# Patient Record
Sex: Female | Born: 1950 | Race: White | Hispanic: No | Marital: Married | Smoking: Never smoker
Health system: Southern US, Community
[De-identification: ages and names within clinical notes are randomized; demographics above are authoritative.]

## PROBLEM LIST (undated history)

## (undated) DIAGNOSIS — I10 Essential (primary) hypertension: Secondary | ICD-10-CM

## (undated) DIAGNOSIS — J302 Other seasonal allergic rhinitis: Secondary | ICD-10-CM

## (undated) DIAGNOSIS — M1811 Unilateral primary osteoarthritis of first carpometacarpal joint, right hand: Secondary | ICD-10-CM

## (undated) DIAGNOSIS — K219 Gastro-esophageal reflux disease without esophagitis: Secondary | ICD-10-CM

## (undated) HISTORY — PX: LASIK: SHX215

## (undated) HISTORY — PX: TONSILLECTOMY: SUR1361

---

## 1998-10-19 ENCOUNTER — Other Ambulatory Visit: Admission: RE | Admit: 1998-10-19 | Discharge: 1998-10-19 | Payer: Self-pay | Admitting: Obstetrics and Gynecology

## 1999-10-20 ENCOUNTER — Other Ambulatory Visit: Admission: RE | Admit: 1999-10-20 | Discharge: 1999-10-20 | Payer: Self-pay | Admitting: Obstetrics and Gynecology

## 2000-08-30 ENCOUNTER — Encounter: Payer: Self-pay | Admitting: Obstetrics and Gynecology

## 2000-08-30 ENCOUNTER — Encounter
Admission: RE | Admit: 2000-08-30 | Discharge: 2000-08-30 | Payer: Self-pay | Admitting: Physical Medicine & Rehabilitation

## 2000-11-22 ENCOUNTER — Other Ambulatory Visit: Admission: RE | Admit: 2000-11-22 | Discharge: 2000-11-22 | Payer: Self-pay | Admitting: Obstetrics and Gynecology

## 2001-09-13 ENCOUNTER — Encounter: Payer: Self-pay | Admitting: Obstetrics and Gynecology

## 2001-09-13 ENCOUNTER — Encounter: Admission: RE | Admit: 2001-09-13 | Discharge: 2001-09-13 | Payer: Self-pay | Admitting: Obstetrics and Gynecology

## 2002-05-05 ENCOUNTER — Encounter: Payer: Self-pay | Admitting: Obstetrics and Gynecology

## 2002-05-05 ENCOUNTER — Encounter: Admission: RE | Admit: 2002-05-05 | Discharge: 2002-05-05 | Payer: Self-pay | Admitting: Obstetrics and Gynecology

## 2002-07-01 ENCOUNTER — Encounter: Admission: RE | Admit: 2002-07-01 | Discharge: 2002-07-01 | Payer: Self-pay | Admitting: Obstetrics and Gynecology

## 2002-07-01 ENCOUNTER — Encounter: Payer: Self-pay | Admitting: Obstetrics and Gynecology

## 2002-10-21 ENCOUNTER — Encounter: Admission: RE | Admit: 2002-10-21 | Discharge: 2002-10-21 | Payer: Self-pay | Admitting: Obstetrics and Gynecology

## 2002-10-21 ENCOUNTER — Encounter: Payer: Self-pay | Admitting: Obstetrics and Gynecology

## 2003-10-26 ENCOUNTER — Encounter: Admission: RE | Admit: 2003-10-26 | Discharge: 2003-10-26 | Payer: Self-pay | Admitting: Obstetrics and Gynecology

## 2004-06-07 ENCOUNTER — Other Ambulatory Visit: Admission: RE | Admit: 2004-06-07 | Discharge: 2004-06-07 | Payer: Self-pay | Admitting: Obstetrics and Gynecology

## 2004-10-26 ENCOUNTER — Encounter: Admission: RE | Admit: 2004-10-26 | Discharge: 2004-10-26 | Payer: Self-pay | Admitting: Obstetrics and Gynecology

## 2005-05-31 ENCOUNTER — Other Ambulatory Visit: Admission: RE | Admit: 2005-05-31 | Discharge: 2005-05-31 | Payer: Self-pay | Admitting: Obstetrics and Gynecology

## 2005-11-10 ENCOUNTER — Encounter: Admission: RE | Admit: 2005-11-10 | Discharge: 2005-11-10 | Payer: Self-pay | Admitting: Obstetrics and Gynecology

## 2006-06-07 ENCOUNTER — Other Ambulatory Visit: Admission: RE | Admit: 2006-06-07 | Discharge: 2006-06-07 | Payer: Self-pay | Admitting: Obstetrics and Gynecology

## 2006-11-15 ENCOUNTER — Encounter: Admission: RE | Admit: 2006-11-15 | Discharge: 2006-11-15 | Payer: Self-pay | Admitting: Obstetrics and Gynecology

## 2007-06-10 ENCOUNTER — Other Ambulatory Visit: Admission: RE | Admit: 2007-06-10 | Discharge: 2007-06-10 | Payer: Self-pay | Admitting: Obstetrics and Gynecology

## 2007-11-21 ENCOUNTER — Encounter: Admission: RE | Admit: 2007-11-21 | Discharge: 2007-11-21 | Payer: Self-pay | Admitting: Obstetrics and Gynecology

## 2008-06-09 ENCOUNTER — Other Ambulatory Visit: Admission: RE | Admit: 2008-06-09 | Discharge: 2008-06-09 | Payer: Self-pay | Admitting: Obstetrics and Gynecology

## 2008-11-23 ENCOUNTER — Encounter: Admission: RE | Admit: 2008-11-23 | Discharge: 2008-11-23 | Payer: Self-pay | Admitting: Obstetrics and Gynecology

## 2009-06-11 ENCOUNTER — Other Ambulatory Visit: Admission: RE | Admit: 2009-06-11 | Discharge: 2009-06-11 | Payer: Self-pay | Admitting: Obstetrics and Gynecology

## 2009-11-24 ENCOUNTER — Encounter: Admission: RE | Admit: 2009-11-24 | Discharge: 2009-11-24 | Payer: Self-pay | Admitting: Obstetrics and Gynecology

## 2010-06-13 ENCOUNTER — Other Ambulatory Visit: Admission: RE | Admit: 2010-06-13 | Discharge: 2010-06-13 | Payer: Self-pay | Admitting: Obstetrics and Gynecology

## 2010-11-25 ENCOUNTER — Encounter
Admission: RE | Admit: 2010-11-25 | Discharge: 2010-11-25 | Payer: Self-pay | Source: Home / Self Care | Attending: Obstetrics and Gynecology | Admitting: Obstetrics and Gynecology

## 2011-06-20 ENCOUNTER — Other Ambulatory Visit: Payer: Self-pay | Admitting: Obstetrics and Gynecology

## 2011-06-20 ENCOUNTER — Other Ambulatory Visit (HOSPITAL_COMMUNITY)
Admission: RE | Admit: 2011-06-20 | Discharge: 2011-06-20 | Disposition: A | Discharge status: Home / Self Care | Payer: PRIVATE HEALTH INSURANCE | Source: Ambulatory Visit | Attending: Obstetrics and Gynecology | Admitting: Obstetrics and Gynecology

## 2011-06-20 DIAGNOSIS — Z01419 Encounter for gynecological examination (general) (routine) without abnormal findings: Secondary | ICD-10-CM | POA: Insufficient documentation

## 2011-11-02 ENCOUNTER — Other Ambulatory Visit: Payer: Self-pay | Admitting: Obstetrics and Gynecology

## 2011-11-02 DIAGNOSIS — Z1231 Encounter for screening mammogram for malignant neoplasm of breast: Secondary | ICD-10-CM

## 2011-11-27 ENCOUNTER — Ambulatory Visit
Admission: RE | Admit: 2011-11-27 | Discharge: 2011-11-27 | Disposition: A | Discharge status: Home / Self Care | Payer: PRIVATE HEALTH INSURANCE | Source: Ambulatory Visit | Attending: Obstetrics and Gynecology | Admitting: Obstetrics and Gynecology

## 2011-11-27 DIAGNOSIS — Z1231 Encounter for screening mammogram for malignant neoplasm of breast: Secondary | ICD-10-CM

## 2012-06-25 ENCOUNTER — Other Ambulatory Visit: Payer: Self-pay | Admitting: Obstetrics and Gynecology

## 2012-06-25 ENCOUNTER — Other Ambulatory Visit (HOSPITAL_COMMUNITY)
Admission: RE | Admit: 2012-06-25 | Discharge: 2012-06-25 | Disposition: A | Discharge status: Home / Self Care | Payer: PRIVATE HEALTH INSURANCE | Source: Ambulatory Visit | Attending: Obstetrics and Gynecology | Admitting: Obstetrics and Gynecology

## 2012-06-25 DIAGNOSIS — Z01419 Encounter for gynecological examination (general) (routine) without abnormal findings: Secondary | ICD-10-CM | POA: Insufficient documentation

## 2012-10-21 ENCOUNTER — Other Ambulatory Visit: Payer: Self-pay | Admitting: Obstetrics and Gynecology

## 2012-10-21 DIAGNOSIS — Z1231 Encounter for screening mammogram for malignant neoplasm of breast: Secondary | ICD-10-CM

## 2012-11-27 ENCOUNTER — Ambulatory Visit
Admission: RE | Admit: 2012-11-27 | Discharge: 2012-11-27 | Disposition: A | Discharge status: Home / Self Care | Payer: PRIVATE HEALTH INSURANCE | Source: Ambulatory Visit | Attending: Obstetrics and Gynecology | Admitting: Obstetrics and Gynecology

## 2012-11-27 DIAGNOSIS — Z1231 Encounter for screening mammogram for malignant neoplasm of breast: Secondary | ICD-10-CM

## 2013-06-26 ENCOUNTER — Other Ambulatory Visit: Payer: Self-pay | Admitting: Obstetrics and Gynecology

## 2013-06-26 ENCOUNTER — Other Ambulatory Visit (HOSPITAL_COMMUNITY)
Admission: RE | Admit: 2013-06-26 | Discharge: 2013-06-26 | Disposition: A | Discharge status: Home / Self Care | Payer: PRIVATE HEALTH INSURANCE | Source: Ambulatory Visit | Attending: Obstetrics and Gynecology | Admitting: Obstetrics and Gynecology

## 2013-06-26 DIAGNOSIS — Z1151 Encounter for screening for human papillomavirus (HPV): Secondary | ICD-10-CM | POA: Insufficient documentation

## 2013-06-26 DIAGNOSIS — Z01419 Encounter for gynecological examination (general) (routine) without abnormal findings: Secondary | ICD-10-CM | POA: Insufficient documentation

## 2013-10-15 ENCOUNTER — Other Ambulatory Visit: Payer: Self-pay

## 2013-10-15 DIAGNOSIS — Z1231 Encounter for screening mammogram for malignant neoplasm of breast: Secondary | ICD-10-CM

## 2013-11-28 ENCOUNTER — Ambulatory Visit
Admission: RE | Admit: 2013-11-28 | Discharge: 2013-11-28 | Disposition: A | Discharge status: Home / Self Care | Payer: PRIVATE HEALTH INSURANCE | Source: Ambulatory Visit

## 2013-11-28 DIAGNOSIS — Z1231 Encounter for screening mammogram for malignant neoplasm of breast: Secondary | ICD-10-CM

## 2014-06-30 ENCOUNTER — Other Ambulatory Visit (HOSPITAL_COMMUNITY)
Admission: RE | Admit: 2014-06-30 | Discharge: 2014-06-30 | Disposition: A | Discharge status: Home / Self Care | Payer: PRIVATE HEALTH INSURANCE | Source: Ambulatory Visit | Attending: Obstetrics and Gynecology | Admitting: Obstetrics and Gynecology

## 2014-06-30 ENCOUNTER — Other Ambulatory Visit: Payer: Self-pay | Admitting: Obstetrics and Gynecology

## 2014-06-30 DIAGNOSIS — Z01419 Encounter for gynecological examination (general) (routine) without abnormal findings: Secondary | ICD-10-CM | POA: Insufficient documentation

## 2014-07-01 LAB — CYTOLOGY - PAP

## 2014-11-10 ENCOUNTER — Other Ambulatory Visit: Payer: Self-pay

## 2014-11-10 DIAGNOSIS — Z1231 Encounter for screening mammogram for malignant neoplasm of breast: Secondary | ICD-10-CM

## 2014-12-02 ENCOUNTER — Ambulatory Visit
Admission: RE | Admit: 2014-12-02 | Discharge: 2014-12-02 | Disposition: A | Discharge status: Home / Self Care | Payer: PRIVATE HEALTH INSURANCE | Source: Ambulatory Visit

## 2014-12-02 DIAGNOSIS — Z1231 Encounter for screening mammogram for malignant neoplasm of breast: Secondary | ICD-10-CM

## 2015-07-07 ENCOUNTER — Other Ambulatory Visit (HOSPITAL_COMMUNITY)
Admission: RE | Admit: 2015-07-07 | Discharge: 2015-07-07 | Disposition: A | Discharge status: Home / Self Care | Payer: PRIVATE HEALTH INSURANCE | Source: Ambulatory Visit | Attending: Obstetrics and Gynecology | Admitting: Obstetrics and Gynecology

## 2015-07-07 ENCOUNTER — Other Ambulatory Visit: Payer: Self-pay | Admitting: Obstetrics and Gynecology

## 2015-07-07 DIAGNOSIS — Z01419 Encounter for gynecological examination (general) (routine) without abnormal findings: Secondary | ICD-10-CM | POA: Diagnosis present

## 2015-07-08 LAB — CYTOLOGY - PAP

## 2015-11-10 ENCOUNTER — Other Ambulatory Visit: Payer: Self-pay

## 2015-11-10 DIAGNOSIS — Z1231 Encounter for screening mammogram for malignant neoplasm of breast: Secondary | ICD-10-CM

## 2015-12-06 ENCOUNTER — Ambulatory Visit
Admission: RE | Admit: 2015-12-06 | Discharge: 2015-12-06 | Disposition: A | Discharge status: Home / Self Care | Payer: PRIVATE HEALTH INSURANCE | Source: Ambulatory Visit

## 2015-12-06 DIAGNOSIS — Z1231 Encounter for screening mammogram for malignant neoplasm of breast: Secondary | ICD-10-CM

## 2016-07-07 ENCOUNTER — Other Ambulatory Visit: Payer: Self-pay | Admitting: Obstetrics and Gynecology

## 2016-07-07 ENCOUNTER — Other Ambulatory Visit (HOSPITAL_COMMUNITY)
Admission: RE | Admit: 2016-07-07 | Discharge: 2016-07-07 | Disposition: A | Discharge status: Home / Self Care | Payer: PRIVATE HEALTH INSURANCE | Source: Ambulatory Visit | Attending: Obstetrics and Gynecology | Admitting: Obstetrics and Gynecology

## 2016-07-07 DIAGNOSIS — Z1151 Encounter for screening for human papillomavirus (HPV): Secondary | ICD-10-CM | POA: Diagnosis present

## 2016-07-07 DIAGNOSIS — Z01419 Encounter for gynecological examination (general) (routine) without abnormal findings: Secondary | ICD-10-CM | POA: Diagnosis present

## 2016-07-11 LAB — CYTOLOGY - PAP

## 2016-11-10 ENCOUNTER — Other Ambulatory Visit: Payer: Self-pay | Admitting: Obstetrics and Gynecology

## 2016-11-10 DIAGNOSIS — Z1231 Encounter for screening mammogram for malignant neoplasm of breast: Secondary | ICD-10-CM

## 2016-12-06 ENCOUNTER — Ambulatory Visit
Admission: RE | Admit: 2016-12-06 | Discharge: 2016-12-06 | Disposition: A | Discharge status: Home / Self Care | Payer: PRIVATE HEALTH INSURANCE | Source: Ambulatory Visit | Attending: Obstetrics and Gynecology | Admitting: Obstetrics and Gynecology

## 2016-12-06 DIAGNOSIS — Z1231 Encounter for screening mammogram for malignant neoplasm of breast: Secondary | ICD-10-CM

## 2017-10-26 ENCOUNTER — Other Ambulatory Visit: Payer: Self-pay | Admitting: Obstetrics and Gynecology

## 2017-10-26 DIAGNOSIS — Z1231 Encounter for screening mammogram for malignant neoplasm of breast: Secondary | ICD-10-CM

## 2017-12-07 ENCOUNTER — Ambulatory Visit
Admission: RE | Admit: 2017-12-07 | Discharge: 2017-12-07 | Disposition: A | Discharge status: Home / Self Care | Payer: PRIVATE HEALTH INSURANCE | Source: Ambulatory Visit | Attending: Obstetrics and Gynecology | Admitting: Obstetrics and Gynecology

## 2017-12-07 DIAGNOSIS — Z1231 Encounter for screening mammogram for malignant neoplasm of breast: Secondary | ICD-10-CM

## 2018-03-28 ENCOUNTER — Other Ambulatory Visit: Payer: Self-pay | Admitting: Orthopedic Surgery

## 2018-04-06 DIAGNOSIS — M1811 Unilateral primary osteoarthritis of first carpometacarpal joint, right hand: Secondary | ICD-10-CM

## 2018-04-06 HISTORY — DX: Unilateral primary osteoarthritis of first carpometacarpal joint, right hand: M18.11

## 2018-04-25 ENCOUNTER — Other Ambulatory Visit: Payer: Self-pay

## 2018-04-25 ENCOUNTER — Encounter (HOSPITAL_BASED_OUTPATIENT_CLINIC_OR_DEPARTMENT_OTHER): Payer: Self-pay | Admitting: *Deleted

## 2018-04-25 NOTE — Pre-Procedure Instructions (Signed)
To come for EKG 

## 2018-05-02 ENCOUNTER — Encounter (HOSPITAL_BASED_OUTPATIENT_CLINIC_OR_DEPARTMENT_OTHER)
Admission: RE | Disposition: A | Discharge status: Home / Self Care | Payer: Self-pay | Source: Ambulatory Visit | Attending: Orthopedic Surgery

## 2018-05-02 ENCOUNTER — Ambulatory Visit (HOSPITAL_BASED_OUTPATIENT_CLINIC_OR_DEPARTMENT_OTHER)
Admission: RE | Admit: 2018-05-02 | Discharge: 2018-05-02 | Disposition: A | Discharge status: Home / Self Care | Payer: PRIVATE HEALTH INSURANCE | Source: Ambulatory Visit | Attending: Orthopedic Surgery | Admitting: Orthopedic Surgery

## 2018-05-02 ENCOUNTER — Encounter (HOSPITAL_BASED_OUTPATIENT_CLINIC_OR_DEPARTMENT_OTHER): Payer: Self-pay | Admitting: Anesthesiology

## 2018-05-02 ENCOUNTER — Other Ambulatory Visit: Payer: Self-pay

## 2018-05-02 ENCOUNTER — Ambulatory Visit (HOSPITAL_BASED_OUTPATIENT_CLINIC_OR_DEPARTMENT_OTHER): Payer: PRIVATE HEALTH INSURANCE | Admitting: Anesthesiology

## 2018-05-02 DIAGNOSIS — Z791 Long term (current) use of non-steroidal anti-inflammatories (NSAID): Secondary | ICD-10-CM | POA: Insufficient documentation

## 2018-05-02 DIAGNOSIS — Z79899 Other long term (current) drug therapy: Secondary | ICD-10-CM | POA: Diagnosis not present

## 2018-05-02 DIAGNOSIS — M1811 Unilateral primary osteoarthritis of first carpometacarpal joint, right hand: Secondary | ICD-10-CM | POA: Diagnosis present

## 2018-05-02 DIAGNOSIS — Z880 Allergy status to penicillin: Secondary | ICD-10-CM | POA: Insufficient documentation

## 2018-05-02 DIAGNOSIS — I110 Hypertensive heart disease with heart failure: Secondary | ICD-10-CM | POA: Insufficient documentation

## 2018-05-02 HISTORY — PX: CARPOMETACARPEL SUSPENSION PLASTY: SHX5005

## 2018-05-02 HISTORY — DX: Gastro-esophageal reflux disease without esophagitis: K21.9

## 2018-05-02 HISTORY — PX: TENDON TRANSFER: SHX6109

## 2018-05-02 HISTORY — DX: Other seasonal allergic rhinitis: J30.2

## 2018-05-02 HISTORY — DX: Unilateral primary osteoarthritis of first carpometacarpal joint, right hand: M18.11

## 2018-05-02 HISTORY — DX: Essential (primary) hypertension: I10

## 2018-05-02 SURGERY — CARPOMETACARPEL (CMC) SUSPENSION PLASTY
Anesthesia: General | Site: Hand | Laterality: Right

## 2018-05-02 MED ORDER — VANCOMYCIN HCL IN DEXTROSE 500-5 MG/100ML-% IV SOLN
INTRAVENOUS | Status: AC
  Filled 2018-05-02: qty 100

## 2018-05-02 MED ORDER — CEFAZOLIN SODIUM-DEXTROSE 2-4 GM/100ML-% IV SOLN: 2.0000 g | INTRAVENOUS | Status: DC

## 2018-05-02 MED ORDER — ACETAMINOPHEN 160 MG/5ML PO SOLN: 325.0000 mg | ORAL | Status: DC | PRN

## 2018-05-02 MED ORDER — LACTATED RINGERS IV SOLN
INTRAVENOUS | Status: DC
  Administered 2018-05-02: 11:00:00 via INTRAVENOUS

## 2018-05-02 MED ORDER — CEFAZOLIN SODIUM-DEXTROSE 2-4 GM/100ML-% IV SOLN
INTRAVENOUS | Status: AC
  Filled 2018-05-02: qty 100

## 2018-05-02 MED ORDER — LIDOCAINE HCL (CARDIAC) PF 100 MG/5ML IV SOSY
PREFILLED_SYRINGE | INTRAVENOUS | Status: DC | PRN
  Administered 2018-05-02: 30 mg via INTRAVENOUS

## 2018-05-02 MED ORDER — FENTANYL CITRATE (PF) 100 MCG/2ML IJ SOLN
INTRAMUSCULAR | Status: AC
  Filled 2018-05-02: qty 2

## 2018-05-02 MED ORDER — ONDANSETRON HCL 4 MG/2ML IJ SOLN
INTRAMUSCULAR | Status: DC | PRN
  Administered 2018-05-02: 4 mg via INTRAVENOUS

## 2018-05-02 MED ORDER — BUPIVACAINE HCL (PF) 0.75 % IJ SOLN
INTRAMUSCULAR | Status: DC | PRN
  Administered 2018-05-02: 25 mL via PERINEURAL

## 2018-05-02 MED ORDER — CHLORHEXIDINE GLUCONATE 4 % EX LIQD: 60.0000 mL | Freq: Once | CUTANEOUS | Status: DC

## 2018-05-02 MED ORDER — OXYCODONE-ACETAMINOPHEN 5-325 MG PO TABS: 1.0000 | ORAL_TABLET | ORAL | 0 refills | Status: AC | PRN

## 2018-05-02 MED ORDER — FENTANYL CITRATE (PF) 100 MCG/2ML IJ SOLN
50.0000 ug | INTRAMUSCULAR | Status: DC | PRN
  Administered 2018-05-02: 50 ug via INTRAVENOUS

## 2018-05-02 MED ORDER — LIDOCAINE HCL (CARDIAC) PF 100 MG/5ML IV SOSY
PREFILLED_SYRINGE | INTRAVENOUS | Status: AC
  Filled 2018-05-02: qty 5

## 2018-05-02 MED ORDER — MIDAZOLAM HCL 2 MG/2ML IJ SOLN
INTRAMUSCULAR | Status: AC
  Filled 2018-05-02: qty 2

## 2018-05-02 MED ORDER — BUPIVACAINE HCL (PF) 0.25 % IJ SOLN
INTRAMUSCULAR | Status: AC
  Filled 2018-05-02: qty 30

## 2018-05-02 MED ORDER — MIDAZOLAM HCL 2 MG/2ML IJ SOLN
1.0000 mg | INTRAMUSCULAR | Status: DC | PRN
  Administered 2018-05-02: 1 mg via INTRAVENOUS

## 2018-05-02 MED ORDER — MEPERIDINE HCL 25 MG/ML IJ SOLN: 6.2500 mg | INTRAMUSCULAR | Status: DC | PRN

## 2018-05-02 MED ORDER — DEXAMETHASONE SODIUM PHOSPHATE 10 MG/ML IJ SOLN
INTRAMUSCULAR | Status: AC
  Filled 2018-05-02: qty 1

## 2018-05-02 MED ORDER — ONDANSETRON HCL 4 MG/2ML IJ SOLN: 4.0000 mg | Freq: Once | INTRAMUSCULAR | Status: DC | PRN

## 2018-05-02 MED ORDER — EPHEDRINE SULFATE 50 MG/ML IJ SOLN
INTRAMUSCULAR | Status: DC | PRN
  Administered 2018-05-02: 10 mg via INTRAVENOUS

## 2018-05-02 MED ORDER — VANCOMYCIN HCL 500 MG IV SOLR
500.0000 mg | Freq: Once | INTRAVENOUS | Status: AC
  Administered 2018-05-02: 500 mg via INTRAVENOUS

## 2018-05-02 MED ORDER — SCOPOLAMINE 1 MG/3DAYS TD PT72: 1.0000 | MEDICATED_PATCH | Freq: Once | TRANSDERMAL | Status: DC | PRN

## 2018-05-02 MED ORDER — OXYCODONE HCL 5 MG PO TABS: 5.0000 mg | ORAL_TABLET | Freq: Once | ORAL | Status: DC | PRN

## 2018-05-02 MED ORDER — ONDANSETRON HCL 4 MG/2ML IJ SOLN
INTRAMUSCULAR | Status: AC
  Filled 2018-05-02: qty 2

## 2018-05-02 MED ORDER — DEXAMETHASONE SODIUM PHOSPHATE 10 MG/ML IJ SOLN
INTRAMUSCULAR | Status: DC | PRN
  Administered 2018-05-02: 4 mg via INTRAVENOUS

## 2018-05-02 MED ORDER — ACETAMINOPHEN 325 MG PO TABS: 325.0000 mg | ORAL_TABLET | ORAL | Status: DC | PRN

## 2018-05-02 MED ORDER — PROPOFOL 10 MG/ML IV BOLUS
INTRAVENOUS | Status: DC | PRN
  Administered 2018-05-02: 100 mg via INTRAVENOUS

## 2018-05-02 MED ORDER — OXYCODONE HCL 5 MG/5ML PO SOLN: 5.0000 mg | Freq: Once | ORAL | Status: DC | PRN

## 2018-05-02 MED ORDER — FENTANYL CITRATE (PF) 100 MCG/2ML IJ SOLN: 25.0000 ug | INTRAMUSCULAR | Status: DC | PRN

## 2018-05-02 SURGICAL SUPPLY — 86 items
BAG DECANTER FOR FLEXI CONT (MISCELLANEOUS) IMPLANT
BALL CTTN LRG ABS STRL LF (GAUZE/BANDAGES/DRESSINGS)
BIT DRILL 3/32DIAX5INL DISPOSE (BIT) IMPLANT
BIT DRILL 3/32DX5IN DISP (BIT) ×1
BLADE MINI RND TIP GREEN BEAV (BLADE) ×3 IMPLANT
BLADE SURG 15 STRL LF DISP TIS (BLADE) ×1 IMPLANT
BLADE SURG 15 STRL SS (BLADE) ×3
BNDG CMPR 9X4 STRL LF SNTH (GAUZE/BANDAGES/DRESSINGS) ×1
BNDG COHESIVE 3X5 TAN STRL LF (GAUZE/BANDAGES/DRESSINGS) ×3 IMPLANT
BNDG ESMARK 4X9 LF (GAUZE/BANDAGES/DRESSINGS) ×3 IMPLANT
BNDG GAUZE ELAST 4 BULKY (GAUZE/BANDAGES/DRESSINGS) ×3 IMPLANT
CHLORAPREP W/TINT 26ML (MISCELLANEOUS) ×3 IMPLANT
CORD BIPOLAR FORCEPS 12FT (ELECTRODE) ×3 IMPLANT
COTTONBALL LRG STERILE PKG (GAUZE/BANDAGES/DRESSINGS) IMPLANT
COVER BACK TABLE 60X90IN (DRAPES) ×3 IMPLANT
COVER MAYO STAND STRL (DRAPES) ×3 IMPLANT
CUFF TOURNIQUET SINGLE 18IN (TOURNIQUET CUFF) ×2 IMPLANT
DECANTER SPIKE VIAL GLASS SM (MISCELLANEOUS) IMPLANT
DRAIN TLS ROUND 10FR (DRAIN) IMPLANT
DRAPE EXTREMITY T 121X128X90 (DRAPE) ×3 IMPLANT
DRAPE OEC MINIVIEW 54X84 (DRAPES) ×3 IMPLANT
DRAPE SURG 17X23 STRL (DRAPES) ×3 IMPLANT
DRILL BIT 3/32DIAX5INL DISPOSE (BIT) ×3
DRSG PAD ABDOMINAL 8X10 ST (GAUZE/BANDAGES/DRESSINGS) IMPLANT
GAUZE SPONGE 4X4 12PLY STRL (GAUZE/BANDAGES/DRESSINGS) ×3 IMPLANT
GAUZE SPONGE 4X4 16PLY XRAY LF (GAUZE/BANDAGES/DRESSINGS) IMPLANT
GAUZE XEROFORM 1X8 LF (GAUZE/BANDAGES/DRESSINGS) ×3 IMPLANT
GLOVE BIO SURGEON STRL SZ 6.5 (GLOVE) ×1 IMPLANT
GLOVE BIO SURGEON STRL SZ7.5 (GLOVE) ×2 IMPLANT
GLOVE BIO SURGEONS STRL SZ 6.5 (GLOVE) ×1
GLOVE BIOGEL PI IND STRL 6.5 (GLOVE) IMPLANT
GLOVE BIOGEL PI IND STRL 8 (GLOVE) IMPLANT
GLOVE BIOGEL PI IND STRL 8.5 (GLOVE) ×1 IMPLANT
GLOVE BIOGEL PI INDICATOR 6.5 (GLOVE) ×2
GLOVE BIOGEL PI INDICATOR 8 (GLOVE) ×2
GLOVE BIOGEL PI INDICATOR 8.5 (GLOVE) ×2
GLOVE SURG ORTHO 8.0 STRL STRW (GLOVE) ×3 IMPLANT
GOWN STRL REUS W/ TWL LRG LVL3 (GOWN DISPOSABLE) ×1 IMPLANT
GOWN STRL REUS W/TWL LRG LVL3 (GOWN DISPOSABLE) ×3
GOWN STRL REUS W/TWL XL LVL3 (GOWN DISPOSABLE) ×5 IMPLANT
K-WIRE .035X4 (WIRE) IMPLANT
LOOP VESSEL MAXI BLUE (MISCELLANEOUS) IMPLANT
NDL KEITH (NEEDLE) IMPLANT
NDL PRECISIONGLIDE 27X1.5 (NEEDLE) IMPLANT
NEEDLE HYPO 22GX1.5 SAFETY (NEEDLE) IMPLANT
NEEDLE KEITH (NEEDLE) IMPLANT
NEEDLE PRECISIONGLIDE 27X1.5 (NEEDLE) ×3 IMPLANT
NS IRRIG 1000ML POUR BTL (IV SOLUTION) ×3 IMPLANT
PACK BASIN DAY SURGERY FS (CUSTOM PROCEDURE TRAY) ×3 IMPLANT
PAD CAST 3X4 CTTN HI CHSV (CAST SUPPLIES) ×1 IMPLANT
PADDING CAST ABS 3INX4YD NS (CAST SUPPLIES)
PADDING CAST ABS 4INX4YD NS (CAST SUPPLIES) ×2
PADDING CAST ABS COTTON 3X4 (CAST SUPPLIES) IMPLANT
PADDING CAST ABS COTTON 4X4 ST (CAST SUPPLIES) ×1 IMPLANT
PADDING CAST COTTON 3X4 STRL (CAST SUPPLIES) ×3
SLEEVE SCD COMPRESS KNEE MED (MISCELLANEOUS) ×3 IMPLANT
SPLINT PLASTER CAST XFAST 3X15 (CAST SUPPLIES) IMPLANT
SPLINT PLASTER XTRA FASTSET 3X (CAST SUPPLIES) ×20
STOCKINETTE 4X48 STRL (DRAPES) ×3 IMPLANT
SUT CHROMIC 5 0 P 3 (SUTURE) IMPLANT
SUT ETHIBOND 3-0 V-5 (SUTURE) ×3 IMPLANT
SUT ETHILON 4 0 PS 2 18 (SUTURE) ×3 IMPLANT
SUT FIBERWIRE 2-0 18 17.9 3/8 (SUTURE)
SUT FIBERWIRE 4-0 18 DIAM BLUE (SUTURE)
SUT FIBERWIRE 4-0 18 TAPR NDL (SUTURE)
SUT MERSILENE 2.0 SH NDLE (SUTURE) IMPLANT
SUT MERSILENE 4 0 P 3 (SUTURE) IMPLANT
SUT PROLENE 2 0 SH DA (SUTURE) IMPLANT
SUT SILK 2 0 PERMA HAND 18 BK (SUTURE) IMPLANT
SUT SILK 4 0 PS 2 (SUTURE) IMPLANT
SUT STEEL 3 0 (SUTURE) ×3 IMPLANT
SUT VIC AB 3-0 PS1 18 (SUTURE)
SUT VIC AB 3-0 PS1 18XBRD (SUTURE) IMPLANT
SUT VIC AB 4-0 P-3 18XBRD (SUTURE) IMPLANT
SUT VIC AB 4-0 P2 18 (SUTURE) IMPLANT
SUT VIC AB 4-0 P3 18 (SUTURE)
SUT VICRYL 4-0 PS2 18IN ABS (SUTURE) IMPLANT
SUTURE FIBERWR 2-0 18 17.9 3/8 (SUTURE) IMPLANT
SUTURE FIBERWR 4-0 18 DIA BLUE (SUTURE) IMPLANT
SUTURE FIBERWR 4-0 18 TAPR NDL (SUTURE) IMPLANT
SYR BULB 3OZ (MISCELLANEOUS) ×3 IMPLANT
SYR CONTROL 10ML LL (SYRINGE) ×2 IMPLANT
TOWEL GREEN STERILE FF (TOWEL DISPOSABLE) ×6 IMPLANT
TOWEL OR NON WOVEN STRL DISP B (DISPOSABLE) ×3 IMPLANT
TUBE FEEDING ENTERAL 5FR 16IN (TUBING) IMPLANT
UNDERPAD 30X30 (UNDERPADS AND DIAPERS) ×3 IMPLANT

## 2018-05-02 NOTE — Anesthesia Procedure Notes (Signed)
Procedure Name: LMA Insertion Date/Time: 05/02/2018 11:55 AM Performed by: Signe Colt, CRNA Pre-anesthesia Checklist: Patient identified, Emergency Drugs available, Suction available and Patient being monitored Patient Re-evaluated:Patient Re-evaluated prior to induction Oxygen Delivery Method: Circle system utilized Preoxygenation: Pre-oxygenation with 100% oxygen Induction Type: IV induction Ventilation: Mask ventilation without difficulty LMA: LMA inserted LMA Size: 3.0 Number of attempts: 1 Airway Equipment and Method: Bite block Placement Confirmation: positive ETCO2 Tube secured with: Tape Dental Injury: Teeth and Oropharynx as per pre-operative assessment

## 2018-05-02 NOTE — Brief Op Note (Signed)
05/02/2018  1:05 PM  PATIENT:  Brandy Davis  66 y.o. 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  PROCEDURE:  Procedure(s): CARPOMETACARPAL (CMC) SUSPENSIONPLASTY RIGHT THUMB, EXCISION TRAPEZIUM (Right) ABDUCTOR POLLICIS LONGUS TRANSFER (Right)  SURGEON:  Surgeon(s) and Role:    * Daryll Brod, MD - Primary    * Leanora Cover, MD - Assisting  PHYSICIAN ASSISTANT:   ASSISTANTS: Leanora Cover, MD  ANESTHESIA:   Supraclavicular block  EBL: 1 mL  BLOOD ADMINISTERED:None  DRAINS:    LOCAL MEDICATIONS USED:  NONE  SPECIMEN:  No Specimen  DISPOSITION OF SPECIMEN:  N/A  COUNTS:  YES  TOURNIQUET:   Total Tourniquet Time Documented: Upper Arm (Right) - 53 minutes Total: Upper Arm (Right) - 53 minutes   DICTATION: .Viviann Spare Dictation  PLAN OF CARE: Discharge to home after PACU  PATIENT DISPOSITION:  PACU - hemodynamically stable.

## 2018-05-02 NOTE — Anesthesia Postprocedure Evaluation (Signed)
Anesthesia Post Note  Patient: Daneya H Vandenheuvel  Procedure(s) Performed: CARPOMETACARPAL (CMC) SUSPENSIONPLASTY RIGHT THUMB, EXCISION TRAPEZIUM (Right Hand) ABDUCTOR POLLICIS LONGUS TRANSFER (Right Hand)     Patient location during evaluation: PACU Anesthesia Type: General Level of consciousness: awake and alert Pain management: pain level controlled Vital Signs Assessment: post-procedure vital signs reviewed and stable Respiratory status: spontaneous breathing, nonlabored ventilation, respiratory function stable and patient connected to nasal cannula oxygen Cardiovascular status: blood pressure returned to baseline and stable Postop Assessment: no apparent nausea or vomiting Anesthetic complications: no    Last Vitals:  Vitals:   05/02/18 1315 05/02/18 1330  BP: (!) 165/89   Pulse: 83 79  Resp: 15 14  Temp:    SpO2: 99% 100%    Last Pain:  Vitals:   05/02/18 1306  TempSrc:   PainSc: Asleep                 Akiyah Eppolito

## 2018-05-02 NOTE — Discharge Instructions (Addendum)

## 2018-05-02 NOTE — Anesthesia Procedure Notes (Addendum)
Anesthesia Regional Block: Axillary brachial plexus block   Pre-Anesthetic Checklist: ,, timeout performed, Correct Patient, Correct Site, Correct Laterality, Correct Procedure, Correct Position, site marked, Risks and benefits discussed,  Surgical consent,  Pre-op evaluation,  At surgeon's request and post-op pain management  Laterality: Right  Prep: chloraprep       Needles:  Injection technique: Single-shot  Needle Type: Echogenic Stimulator Needle     Needle Length: 5cm  Needle Gauge: 22     Additional Needles:   Procedures:, nerve stimulator,,, ultrasound used (permanent image in chart),,,,  Narrative:  Start time: 05/02/2018 11:25 AM End time: 05/02/2018 11:35 AM Injection made incrementally with aspirations every 5 mL.  Performed by: Personally  Anesthesiologist: Janeece Riggers, MD  Additional Notes: Functioning IV was confirmed and monitors were applied.  A 48mm 22ga Arrow echogenic stimulator needle was used. Sterile prep and drape,hand hygiene and sterile gloves were used. Ultrasound guidance: relevant anatomy identified, needle position confirmed, local anesthetic spread visualized around nerve(s)., vascular puncture avoided.  Image printed for medical record. Negative aspiration and negative test dose prior to incremental administration of local anesthetic. The patient tolerated the procedure well.

## 2018-05-02 NOTE — Anesthesia Preprocedure Evaluation (Signed)
Anesthesia Evaluation  Patient identified by MRN, date of birth, ID band Patient awake    Reviewed: Allergy & Precautions, H&P , NPO status , Patient's Chart, lab work & pertinent test results, reviewed documented beta blocker date and time   Airway Mallampati: II  TM Distance: >3 FB Neck ROM: full    Dental no notable dental hx.    Pulmonary    Pulmonary exam normal breath sounds clear to auscultation       Cardiovascular Exercise Tolerance: Good hypertension, Pt. on medications negative cardio ROS   Rhythm:regular Rate:Normal     Neuro/Psych negative psych ROS   GI/Hepatic GERD  ,  Endo/Other    Renal/GU   negative genitourinary   Musculoskeletal  (+) Arthritis , Osteoarthritis,    Abdominal   Peds  Hematology   Anesthesia Other Findings   Reproductive/Obstetrics negative OB ROS                             Anesthesia Physical Anesthesia Plan  ASA: II  Anesthesia Plan: General   Post-op Pain Management:  Regional for Post-op pain   Induction: Intravenous  PONV Risk Score and Plan: Ondansetron, Dexamethasone, Treatment may vary due to age or medical condition and Midazolam  Airway Management Planned: LMA  Additional Equipment:   Intra-op Plan:   Post-operative Plan: Extubation in OR  Informed Consent: I have reviewed the patients History and Physical, chart, labs and discussed the procedure including the risks, benefits and alternatives for the proposed anesthesia with the patient or authorized representative who has indicated his/her understanding and acceptance.   Dental Advisory Given  Plan Discussed with: CRNA, Anesthesiologist and Surgeon  Anesthesia Plan Comments: ( )        Anesthesia Quick Evaluation

## 2018-05-02 NOTE — H&P (Signed)
  Brandy Davis is an 67 y.o. female.   Chief Complaint: thumb painrightHPI:Brandy Davis is a 67 yo female with CMC arthritis right thumb. She was last seen on 03/05/2017. She states that the pain has increased And is making her life difficult for her. He has had injections which have not relieved this for her. She has been wearing splints. States that she is at this point time tired of it and would like to have this corrected. Pain is localized to the basilar joint of her thumb. And has this has a pain scale of 9-10 over 10 with use. It is any pinching or gripping increases this for her. She has no history of diabetes thyroid problems or gout. She does have a history of arthritis. Family history is positive diabetes negative for thyroid problems and gout positive for arthritis.      Past Medical History:  Diagnosis Date  . Arthritis of carpometacarpal [facility]) joint of right thumb 04/2018  . GERD (gastroesophageal reflux disease)    diet-controlled  . Hypertension    states under control with med., has been on med. since 2014  . Seasonal allergies     Past Surgical History:  Procedure Laterality Date  . LASIK    . TONSILLECTOMY     age 38    History reviewed. No pertinent family history. Social History:  reports that she has never smoked. She has never used smokeless tobacco. She reports that she drinks alcohol. She reports that she does not use drugs.  Allergies:  Allergies  Allergen Reactions  . Penicillins Other (See Comments)    UNKNOWN - AS A CHILD    No medications prior to admission.    No results found for this or any previous visit (from the past 48 hour(s)).  No results found.   Pertinent items are noted in HPI.  Height 5\' 3"  (1.6 m), weight 43.1 kg (95 lb).  General appearance: alert, cooperative and appears stated age Head: Normocephalic, without obvious abnormality Neck: no JVD Resp: clear to auscultation bilaterally Cardio: regular rate and rhythm, S1, S2 normal, no  murmur, click, rub or gallop GI: soft, non-tender; bowel sounds normal; no masses,  no organomegaly Extremities: pain right thumb Pulses: 2+ and symmetric Skin: Skin color, texture, turgor normal. No rashes or lesions Neurologic: Grossly normal Incision/Wound: na  Assessment/Plan Assessment:  1. Primary osteoarthritis of both first carpometacarpal joints    Plan: Have discussed surgical intervention with suspension plasty excision of the trapezium with APL transfer. Pre-peri-postoperative course are discussed risk applications. She is aware there is no guarantee to the surgery the possibility of infection recurrence injury to arteries nerves tendons complete relief symptoms and dystrophy. She would like to proceed she is scheduled for suspension plasty right thumb as an outpatient under regional anesthesia. Questions are encouraged and answered to her satisfaction. She is wondering if her webspace will increase if he states I have told her that we cannot guarantee that I will occur that there is probably some contracture present in the muscles but this may be able to be worked out.      Hajra Port R 05/02/2018, 9:38 AM

## 2018-05-02 NOTE — Progress Notes (Signed)
Assisted Dr. Oddono with right, ultrasound guided, axillary block. Side rails up, monitors on throughout procedure. See vital signs in flow sheet. Tolerated Procedure well. 

## 2018-05-02 NOTE — Op Note (Signed)
NAME: Brandy Davis MEDICAL RECORD NO: [id number] DATE OF BIRTH: [birth year] FACILITY: [facility] LOCATION: [facility] PHYSICIAN: Wynonia Sours, MD   OPERATIVE REPORT   DATE OF PROCEDURE: 05/02/18    PREOPERATIVE DIAGNOSIS: Basal joint arthritis right thumb   POSTOPERATIVE DIAGNOSIS:   Same   PROCEDURE:   Suspension plasty with trapezium excision abductor pollicis longus tendon transfer   SURGEON: Daryll Brod, M.D.   ASSISTANT: Leanora Cover, MD   ANESTHESIA:  Regional with sedation   INTRAVENOUS FLUIDS:  Per anesthesia flow sheet.   ESTIMATED BLOOD LOSS:  Minimal.   COMPLICATIONS:  None.   SPECIMENS: None   TOURNIQUET TIME:    Total Tourniquet Time Documented: Upper Arm (Right) - 53 minutes Total: Upper Arm (Right) - 53 minutes    DISPOSITION:  Stable to PACU.   INDICATIONS: Brandy Davis is a 67 year old female with a history of pain to the basilar joint of her right thumb.  This is not responded to conservative treatment.  X-rays reveal significant degenerative changes with subluxation of the carpometacarpal joint right thumb.  She has elected to undergo arthroplasty of this using suspension plasty with tendon transfer trapezium excision.  Pre-peri-and postoperative course been discussed along with risk complications.  She is aware that there is no guarantee to the surgery the possibility of infection recurrence injury to arteries nerves tendons complete release symptoms and dystrophy.  Preoperative area the patient is seen extremity marked by both patient and surgeon antibiotic given  OPERATIVE COURSE: The patient is brought to the operating room after a supraclavicular block was carried out without difficulty in the preoperative area and the direction the anesthesia department.  She was prepped using ChloraPrep in a supine position with the right arm free.  A three-minute dry time was allowed and timeout taken to confirm patient procedure.  A longitudinal incision was made  curvilinear with a hockey-stick extension distally over the carpometacarpal joint of the thumb and then along the abductor pollicis longus tendon.  This was done after the limb was exsanguinated with an Esmarch bandage and tourniquet placed on the arm was inflated to 250 mmHg.  Radial nerve was identified.  The dissection was carried down between the extensor pollicis brevis abductor pollicis longus tendon.  The carpal metacarpal joint was immediately entered.  A large broken osteophyte was present on the trapezium.  The dissection was carried proximally with blunt dissection identifying the radial artery along with vena comitans.  This was protected with retractors.  The STT joint was then opened.  This allowed the dissection to proceed around the trapezium in a subperiosteal manner.  Return freer elevator was placed into the trapezial trapezoid joint the trapezium was then excised using a rondure in a piecemeal manner.  Care was taken to remove any loose bodies osteophytes present.  The air area between the base of the 2 metacarpals was then debrided.  The most dorsal aspect of the abductor pollicis longus tendon inserting into the base of the thumb metacarpal was then isolated.  The proximal musculotendinous junction was then identified a transverse incision made and the dorsal half of the abductor pollicis longus tendon was then used for a graft this was done by placing a Carroll tendon passer beneath the fascia through the first dorsal extensor compartment and using a monofilament wire is a cheese cutter to isolate this portion of the tendon which was left intact at its insertion and transected at the musculotendinous junction.  The muscle was debrided off  this.  The wound was irrigated and closed with interrupted 4-0 nylon sutures.  Drill holes were then passed in the base of the thumb metacarpal and a dorsal radial to ulnar volar position a second drill hole was placed in the second metacarpal in a volar to  dorsal radial to ulnar direction these were enlarged with 7 6 the fourth drill bit the wound was irrigated a an incision made at the egress in the base of the second metacarpal the abductor pollicis longus tendon was then threaded through the drill holes through the base of the thumb and then through the base of the index metacarpal this brought the graft out through the dorsal ulnar aspect of the index metacarpal a hemostat was then used to create a tunnel to bring the tendon back through to the defect in the job trapezium this was then more wound around the abductor pollicis longus tendon where it emerged from the base of the thumb metacarpal and sutured into position with multiple 3-0 Ethibond sutures.  The remainder of the tendon was then rolled involved sutured and placed into the defect this was then irrigated the capsule closed with figure-of-eight 4-0 Vicryl sutures.  The subcutaneous tissue closed with 4-0 Vicryl and the incision at the base of the thumb and base of the index finger were closed with interrupted 4 nylon sutures.  X-rays were taken during the procedure to assure placement of the drill holes relieving of the tendon and good suspension of the thumb from the index metacarpal.  A sterile compressive dressing dorsal palmar palmar thumb spica splint applied.  Deflation the tourniquet all fingers immediately pink.  She was taken to the recovery room for observation in satisfactory condition.  She will be discharged home to return [facility] in 1 week on Percocet.   Wynonia Sours, MD Electronically signed, 05/02/18

## 2018-05-02 NOTE — Transfer of Care (Signed)
Immediate Anesthesia Transfer of Care Note  Patient: Brandy Davis  Procedure(s) Performed: CARPOMETACARPAL (CMC) SUSPENSIONPLASTY RIGHT THUMB, EXCISION TRAPEZIUM (Right Hand) ABDUCTOR POLLICIS LONGUS TRANSFER (Right Hand)  Patient Location: PACU  Anesthesia Type:GA combined with regional for post-op pain  Level of Consciousness: awake and patient cooperative  Airway & Oxygen Therapy: Patient Spontanous Breathing and Patient connected to face mask oxygen  Post-op Assessment: Report given to RN and Post -op Vital signs reviewed and stable  Post vital signs: Reviewed and stable  Last Vitals:  Vitals Value Taken Time  BP 154/79 05/02/2018  1:06 PM  Temp    Pulse 89 05/02/2018  1:07 PM  Resp 11 05/02/2018  1:07 PM  SpO2 99 % 05/02/2018  1:07 PM  Vitals shown include unvalidated device data.  Last Pain:  Vitals:   05/02/18 1059  TempSrc: Oral  PainSc: 0-No pain         Complications: No apparent anesthesia complications

## 2018-05-03 ENCOUNTER — Encounter (HOSPITAL_BASED_OUTPATIENT_CLINIC_OR_DEPARTMENT_OTHER): Payer: Self-pay | Admitting: Orthopedic Surgery

## 2018-05-08 NOTE — Addendum Note (Signed)
Addendum  created 05/08/18 0918 by Janeece Riggers, MD   Intraprocedure Blocks edited, Sign clinical note

## 2018-06-07 DIAGNOSIS — M79644 Pain in right finger(s): Secondary | ICD-10-CM | POA: Diagnosis not present

## 2018-06-07 DIAGNOSIS — M25441 Effusion, right hand: Secondary | ICD-10-CM | POA: Diagnosis not present

## 2018-06-07 DIAGNOSIS — M25641 Stiffness of right hand, not elsewhere classified: Secondary | ICD-10-CM | POA: Diagnosis not present

## 2018-06-07 DIAGNOSIS — M1811 Unilateral primary osteoarthritis of first carpometacarpal joint, right hand: Secondary | ICD-10-CM | POA: Diagnosis not present

## 2018-06-10 DIAGNOSIS — M25641 Stiffness of right hand, not elsewhere classified: Secondary | ICD-10-CM | POA: Diagnosis not present

## 2018-06-10 DIAGNOSIS — M79644 Pain in right finger(s): Secondary | ICD-10-CM | POA: Diagnosis not present

## 2018-06-10 DIAGNOSIS — M1811 Unilateral primary osteoarthritis of first carpometacarpal joint, right hand: Secondary | ICD-10-CM | POA: Diagnosis not present

## 2018-06-10 DIAGNOSIS — M25441 Effusion, right hand: Secondary | ICD-10-CM | POA: Diagnosis not present

## 2018-06-13 DIAGNOSIS — M1811 Unilateral primary osteoarthritis of first carpometacarpal joint, right hand: Secondary | ICD-10-CM | POA: Diagnosis not present

## 2018-06-13 DIAGNOSIS — M79644 Pain in right finger(s): Secondary | ICD-10-CM | POA: Diagnosis not present

## 2018-06-13 DIAGNOSIS — M25441 Effusion, right hand: Secondary | ICD-10-CM | POA: Diagnosis not present

## 2018-06-13 DIAGNOSIS — M25641 Stiffness of right hand, not elsewhere classified: Secondary | ICD-10-CM | POA: Diagnosis not present

## 2018-06-20 DIAGNOSIS — M1811 Unilateral primary osteoarthritis of first carpometacarpal joint, right hand: Secondary | ICD-10-CM | POA: Diagnosis not present

## 2018-06-20 DIAGNOSIS — M25641 Stiffness of right hand, not elsewhere classified: Secondary | ICD-10-CM | POA: Diagnosis not present

## 2018-06-20 DIAGNOSIS — M25441 Effusion, right hand: Secondary | ICD-10-CM | POA: Diagnosis not present

## 2018-06-20 DIAGNOSIS — M79644 Pain in right finger(s): Secondary | ICD-10-CM | POA: Diagnosis not present

## 2018-06-24 DIAGNOSIS — M1811 Unilateral primary osteoarthritis of first carpometacarpal joint, right hand: Secondary | ICD-10-CM | POA: Diagnosis not present

## 2018-06-24 DIAGNOSIS — M79644 Pain in right finger(s): Secondary | ICD-10-CM | POA: Diagnosis not present

## 2018-06-24 DIAGNOSIS — M25441 Effusion, right hand: Secondary | ICD-10-CM | POA: Diagnosis not present

## 2018-06-24 DIAGNOSIS — M25641 Stiffness of right hand, not elsewhere classified: Secondary | ICD-10-CM | POA: Diagnosis not present

## 2018-06-26 DIAGNOSIS — M79644 Pain in right finger(s): Secondary | ICD-10-CM | POA: Diagnosis not present

## 2018-06-26 DIAGNOSIS — M25441 Effusion, right hand: Secondary | ICD-10-CM | POA: Diagnosis not present

## 2018-06-26 DIAGNOSIS — M25641 Stiffness of right hand, not elsewhere classified: Secondary | ICD-10-CM | POA: Diagnosis not present

## 2018-06-26 DIAGNOSIS — M1811 Unilateral primary osteoarthritis of first carpometacarpal joint, right hand: Secondary | ICD-10-CM | POA: Diagnosis not present

## 2018-06-28 DIAGNOSIS — M1811 Unilateral primary osteoarthritis of first carpometacarpal joint, right hand: Secondary | ICD-10-CM | POA: Diagnosis not present

## 2018-06-28 DIAGNOSIS — M79644 Pain in right finger(s): Secondary | ICD-10-CM | POA: Diagnosis not present

## 2018-06-28 DIAGNOSIS — M25441 Effusion, right hand: Secondary | ICD-10-CM | POA: Diagnosis not present

## 2018-06-28 DIAGNOSIS — M25641 Stiffness of right hand, not elsewhere classified: Secondary | ICD-10-CM | POA: Diagnosis not present

## 2018-07-02 DIAGNOSIS — M25441 Effusion, right hand: Secondary | ICD-10-CM | POA: Diagnosis not present

## 2018-07-02 DIAGNOSIS — M1811 Unilateral primary osteoarthritis of first carpometacarpal joint, right hand: Secondary | ICD-10-CM | POA: Diagnosis not present

## 2018-07-02 DIAGNOSIS — M25641 Stiffness of right hand, not elsewhere classified: Secondary | ICD-10-CM | POA: Diagnosis not present

## 2018-07-02 DIAGNOSIS — M79644 Pain in right finger(s): Secondary | ICD-10-CM | POA: Diagnosis not present

## 2018-07-03 DIAGNOSIS — M25641 Stiffness of right hand, not elsewhere classified: Secondary | ICD-10-CM | POA: Diagnosis not present

## 2018-07-03 DIAGNOSIS — M79644 Pain in right finger(s): Secondary | ICD-10-CM | POA: Diagnosis not present

## 2018-07-03 DIAGNOSIS — M25441 Effusion, right hand: Secondary | ICD-10-CM | POA: Diagnosis not present

## 2018-07-03 DIAGNOSIS — M1811 Unilateral primary osteoarthritis of first carpometacarpal joint, right hand: Secondary | ICD-10-CM | POA: Diagnosis not present

## 2018-07-09 DIAGNOSIS — M79644 Pain in right finger(s): Secondary | ICD-10-CM | POA: Diagnosis not present

## 2018-07-09 DIAGNOSIS — M25441 Effusion, right hand: Secondary | ICD-10-CM | POA: Diagnosis not present

## 2018-07-09 DIAGNOSIS — M25641 Stiffness of right hand, not elsewhere classified: Secondary | ICD-10-CM | POA: Diagnosis not present

## 2018-07-09 DIAGNOSIS — M1811 Unilateral primary osteoarthritis of first carpometacarpal joint, right hand: Secondary | ICD-10-CM | POA: Diagnosis not present

## 2018-07-10 DIAGNOSIS — M25441 Effusion, right hand: Secondary | ICD-10-CM | POA: Diagnosis not present

## 2018-07-10 DIAGNOSIS — M25641 Stiffness of right hand, not elsewhere classified: Secondary | ICD-10-CM | POA: Diagnosis not present

## 2018-07-10 DIAGNOSIS — M1811 Unilateral primary osteoarthritis of first carpometacarpal joint, right hand: Secondary | ICD-10-CM | POA: Diagnosis not present

## 2018-07-10 DIAGNOSIS — M79644 Pain in right finger(s): Secondary | ICD-10-CM | POA: Diagnosis not present

## 2018-07-15 DIAGNOSIS — N952 Postmenopausal atrophic vaginitis: Secondary | ICD-10-CM | POA: Diagnosis not present

## 2018-07-15 DIAGNOSIS — M85859 Other specified disorders of bone density and structure, unspecified thigh: Secondary | ICD-10-CM | POA: Diagnosis not present

## 2018-07-15 DIAGNOSIS — Z01411 Encounter for gynecological examination (general) (routine) with abnormal findings: Secondary | ICD-10-CM | POA: Diagnosis not present

## 2018-07-16 DIAGNOSIS — M79644 Pain in right finger(s): Secondary | ICD-10-CM | POA: Diagnosis not present

## 2018-07-16 DIAGNOSIS — M1811 Unilateral primary osteoarthritis of first carpometacarpal joint, right hand: Secondary | ICD-10-CM | POA: Diagnosis not present

## 2018-07-16 DIAGNOSIS — M25641 Stiffness of right hand, not elsewhere classified: Secondary | ICD-10-CM | POA: Diagnosis not present

## 2018-07-16 DIAGNOSIS — M25441 Effusion, right hand: Secondary | ICD-10-CM | POA: Diagnosis not present

## 2018-07-18 DIAGNOSIS — M79644 Pain in right finger(s): Secondary | ICD-10-CM | POA: Diagnosis not present

## 2018-07-18 DIAGNOSIS — M25441 Effusion, right hand: Secondary | ICD-10-CM | POA: Diagnosis not present

## 2018-07-18 DIAGNOSIS — M25641 Stiffness of right hand, not elsewhere classified: Secondary | ICD-10-CM | POA: Diagnosis not present

## 2018-07-18 DIAGNOSIS — M1811 Unilateral primary osteoarthritis of first carpometacarpal joint, right hand: Secondary | ICD-10-CM | POA: Diagnosis not present

## 2018-07-22 DIAGNOSIS — M25441 Effusion, right hand: Secondary | ICD-10-CM | POA: Diagnosis not present

## 2018-07-22 DIAGNOSIS — M25641 Stiffness of right hand, not elsewhere classified: Secondary | ICD-10-CM | POA: Diagnosis not present

## 2018-07-22 DIAGNOSIS — M79644 Pain in right finger(s): Secondary | ICD-10-CM | POA: Diagnosis not present

## 2018-07-22 DIAGNOSIS — M180 Bilateral primary osteoarthritis of first carpometacarpal joints: Secondary | ICD-10-CM | POA: Diagnosis not present

## 2018-07-22 DIAGNOSIS — M1811 Unilateral primary osteoarthritis of first carpometacarpal joint, right hand: Secondary | ICD-10-CM | POA: Diagnosis not present

## 2018-07-24 DIAGNOSIS — M1811 Unilateral primary osteoarthritis of first carpometacarpal joint, right hand: Secondary | ICD-10-CM | POA: Diagnosis not present

## 2018-07-24 DIAGNOSIS — M25441 Effusion, right hand: Secondary | ICD-10-CM | POA: Diagnosis not present

## 2018-07-24 DIAGNOSIS — M79644 Pain in right finger(s): Secondary | ICD-10-CM | POA: Diagnosis not present

## 2018-07-24 DIAGNOSIS — M25641 Stiffness of right hand, not elsewhere classified: Secondary | ICD-10-CM | POA: Diagnosis not present

## 2018-07-26 DIAGNOSIS — J3089 Other allergic rhinitis: Secondary | ICD-10-CM | POA: Diagnosis not present

## 2018-07-26 DIAGNOSIS — J301 Allergic rhinitis due to pollen: Secondary | ICD-10-CM | POA: Diagnosis not present

## 2018-07-29 DIAGNOSIS — M79644 Pain in right finger(s): Secondary | ICD-10-CM | POA: Diagnosis not present

## 2018-07-29 DIAGNOSIS — M1811 Unilateral primary osteoarthritis of first carpometacarpal joint, right hand: Secondary | ICD-10-CM | POA: Diagnosis not present

## 2018-07-29 DIAGNOSIS — M25441 Effusion, right hand: Secondary | ICD-10-CM | POA: Diagnosis not present

## 2018-07-29 DIAGNOSIS — M25641 Stiffness of right hand, not elsewhere classified: Secondary | ICD-10-CM | POA: Diagnosis not present

## 2018-07-31 DIAGNOSIS — M25441 Effusion, right hand: Secondary | ICD-10-CM | POA: Diagnosis not present

## 2018-07-31 DIAGNOSIS — M25641 Stiffness of right hand, not elsewhere classified: Secondary | ICD-10-CM | POA: Diagnosis not present

## 2018-07-31 DIAGNOSIS — M79644 Pain in right finger(s): Secondary | ICD-10-CM | POA: Diagnosis not present

## 2018-07-31 DIAGNOSIS — M1811 Unilateral primary osteoarthritis of first carpometacarpal joint, right hand: Secondary | ICD-10-CM | POA: Diagnosis not present

## 2018-08-06 DIAGNOSIS — M25641 Stiffness of right hand, not elsewhere classified: Secondary | ICD-10-CM | POA: Diagnosis not present

## 2018-08-06 DIAGNOSIS — M25441 Effusion, right hand: Secondary | ICD-10-CM | POA: Diagnosis not present

## 2018-08-06 DIAGNOSIS — M79644 Pain in right finger(s): Secondary | ICD-10-CM | POA: Diagnosis not present

## 2018-08-06 DIAGNOSIS — M1811 Unilateral primary osteoarthritis of first carpometacarpal joint, right hand: Secondary | ICD-10-CM | POA: Diagnosis not present

## 2018-08-08 DIAGNOSIS — M25441 Effusion, right hand: Secondary | ICD-10-CM | POA: Diagnosis not present

## 2018-08-08 DIAGNOSIS — M25641 Stiffness of right hand, not elsewhere classified: Secondary | ICD-10-CM | POA: Diagnosis not present

## 2018-08-08 DIAGNOSIS — M1811 Unilateral primary osteoarthritis of first carpometacarpal joint, right hand: Secondary | ICD-10-CM | POA: Diagnosis not present

## 2018-08-08 DIAGNOSIS — M79644 Pain in right finger(s): Secondary | ICD-10-CM | POA: Diagnosis not present

## 2018-08-12 DIAGNOSIS — M25441 Effusion, right hand: Secondary | ICD-10-CM | POA: Diagnosis not present

## 2018-08-12 DIAGNOSIS — M25641 Stiffness of right hand, not elsewhere classified: Secondary | ICD-10-CM | POA: Diagnosis not present

## 2018-08-12 DIAGNOSIS — M1811 Unilateral primary osteoarthritis of first carpometacarpal joint, right hand: Secondary | ICD-10-CM | POA: Diagnosis not present

## 2018-08-12 DIAGNOSIS — M79644 Pain in right finger(s): Secondary | ICD-10-CM | POA: Diagnosis not present

## 2018-08-15 DIAGNOSIS — M1811 Unilateral primary osteoarthritis of first carpometacarpal joint, right hand: Secondary | ICD-10-CM | POA: Diagnosis not present

## 2018-08-15 DIAGNOSIS — M79644 Pain in right finger(s): Secondary | ICD-10-CM | POA: Diagnosis not present

## 2018-08-15 DIAGNOSIS — M25641 Stiffness of right hand, not elsewhere classified: Secondary | ICD-10-CM | POA: Diagnosis not present

## 2018-08-15 DIAGNOSIS — M25441 Effusion, right hand: Secondary | ICD-10-CM | POA: Diagnosis not present

## 2018-08-19 DIAGNOSIS — M79644 Pain in right finger(s): Secondary | ICD-10-CM | POA: Diagnosis not present

## 2018-08-19 DIAGNOSIS — M25441 Effusion, right hand: Secondary | ICD-10-CM | POA: Diagnosis not present

## 2018-08-19 DIAGNOSIS — M1811 Unilateral primary osteoarthritis of first carpometacarpal joint, right hand: Secondary | ICD-10-CM | POA: Diagnosis not present

## 2018-08-19 DIAGNOSIS — M25641 Stiffness of right hand, not elsewhere classified: Secondary | ICD-10-CM | POA: Diagnosis not present

## 2018-08-21 DIAGNOSIS — M1811 Unilateral primary osteoarthritis of first carpometacarpal joint, right hand: Secondary | ICD-10-CM | POA: Diagnosis not present

## 2018-08-21 DIAGNOSIS — M25641 Stiffness of right hand, not elsewhere classified: Secondary | ICD-10-CM | POA: Diagnosis not present

## 2018-08-21 DIAGNOSIS — M25441 Effusion, right hand: Secondary | ICD-10-CM | POA: Diagnosis not present

## 2018-08-21 DIAGNOSIS — M79644 Pain in right finger(s): Secondary | ICD-10-CM | POA: Diagnosis not present

## 2018-08-29 DIAGNOSIS — M1811 Unilateral primary osteoarthritis of first carpometacarpal joint, right hand: Secondary | ICD-10-CM | POA: Diagnosis not present

## 2018-08-29 DIAGNOSIS — M25441 Effusion, right hand: Secondary | ICD-10-CM | POA: Diagnosis not present

## 2018-08-29 DIAGNOSIS — M79644 Pain in right finger(s): Secondary | ICD-10-CM | POA: Diagnosis not present

## 2018-08-29 DIAGNOSIS — M25641 Stiffness of right hand, not elsewhere classified: Secondary | ICD-10-CM | POA: Diagnosis not present

## 2018-09-02 DIAGNOSIS — M180 Bilateral primary osteoarthritis of first carpometacarpal joints: Secondary | ICD-10-CM | POA: Diagnosis not present

## 2018-11-05 ENCOUNTER — Other Ambulatory Visit: Payer: Self-pay | Admitting: Obstetrics and Gynecology

## 2018-11-05 DIAGNOSIS — Z1231 Encounter for screening mammogram for malignant neoplasm of breast: Secondary | ICD-10-CM

## 2018-11-15 DIAGNOSIS — M180 Bilateral primary osteoarthritis of first carpometacarpal joints: Secondary | ICD-10-CM | POA: Diagnosis not present

## 2018-11-15 DIAGNOSIS — M19042 Primary osteoarthritis, left hand: Secondary | ICD-10-CM | POA: Diagnosis not present

## 2018-12-02 DIAGNOSIS — I10 Essential (primary) hypertension: Secondary | ICD-10-CM | POA: Diagnosis not present

## 2018-12-02 DIAGNOSIS — M1990 Unspecified osteoarthritis, unspecified site: Secondary | ICD-10-CM | POA: Diagnosis not present

## 2018-12-02 DIAGNOSIS — M8580 Other specified disorders of bone density and structure, unspecified site: Secondary | ICD-10-CM | POA: Diagnosis not present

## 2018-12-02 DIAGNOSIS — Z23 Encounter for immunization: Secondary | ICD-10-CM | POA: Diagnosis not present

## 2018-12-02 DIAGNOSIS — Z681 Body mass index (BMI) 19 or less, adult: Secondary | ICD-10-CM | POA: Diagnosis not present

## 2018-12-02 DIAGNOSIS — Z79899 Other long term (current) drug therapy: Secondary | ICD-10-CM | POA: Diagnosis not present

## 2018-12-09 ENCOUNTER — Ambulatory Visit
Admission: RE | Admit: 2018-12-09 | Discharge: 2018-12-09 | Disposition: A | Discharge status: Home / Self Care | Payer: Medicare Other | Source: Ambulatory Visit | Attending: Obstetrics and Gynecology | Admitting: Obstetrics and Gynecology

## 2018-12-09 DIAGNOSIS — Z1231 Encounter for screening mammogram for malignant neoplasm of breast: Secondary | ICD-10-CM | POA: Diagnosis not present

## 2018-12-10 ENCOUNTER — Other Ambulatory Visit: Payer: Self-pay | Admitting: Obstetrics and Gynecology

## 2018-12-10 DIAGNOSIS — R928 Other abnormal and inconclusive findings on diagnostic imaging of breast: Secondary | ICD-10-CM

## 2018-12-16 ENCOUNTER — Ambulatory Visit: Payer: Medicare Other

## 2018-12-16 ENCOUNTER — Ambulatory Visit
Admission: RE | Admit: 2018-12-16 | Discharge: 2018-12-16 | Disposition: A | Discharge status: Home / Self Care | Payer: Medicare Other | Source: Ambulatory Visit | Attending: Obstetrics and Gynecology | Admitting: Obstetrics and Gynecology

## 2018-12-16 DIAGNOSIS — R928 Other abnormal and inconclusive findings on diagnostic imaging of breast: Secondary | ICD-10-CM

## 2018-12-16 DIAGNOSIS — R922 Inconclusive mammogram: Secondary | ICD-10-CM | POA: Diagnosis not present

## 2018-12-23 DIAGNOSIS — H43811 Vitreous degeneration, right eye: Secondary | ICD-10-CM | POA: Diagnosis not present

## 2018-12-23 DIAGNOSIS — H02831 Dermatochalasis of right upper eyelid: Secondary | ICD-10-CM | POA: Diagnosis not present

## 2018-12-23 DIAGNOSIS — H25813 Combined forms of age-related cataract, bilateral: Secondary | ICD-10-CM | POA: Diagnosis not present

## 2018-12-23 DIAGNOSIS — H40003 Preglaucoma, unspecified, bilateral: Secondary | ICD-10-CM | POA: Diagnosis not present

## 2019-01-24 DIAGNOSIS — M180 Bilateral primary osteoarthritis of first carpometacarpal joints: Secondary | ICD-10-CM | POA: Diagnosis not present

## 2019-01-24 DIAGNOSIS — M19042 Primary osteoarthritis, left hand: Secondary | ICD-10-CM | POA: Diagnosis not present

## 2019-03-11 DIAGNOSIS — D225 Melanocytic nevi of trunk: Secondary | ICD-10-CM | POA: Diagnosis not present

## 2019-03-11 DIAGNOSIS — D1801 Hemangioma of skin and subcutaneous tissue: Secondary | ICD-10-CM | POA: Diagnosis not present

## 2019-03-11 DIAGNOSIS — L570 Actinic keratosis: Secondary | ICD-10-CM | POA: Diagnosis not present

## 2019-03-11 DIAGNOSIS — L821 Other seborrheic keratosis: Secondary | ICD-10-CM | POA: Diagnosis not present

## 2019-03-11 DIAGNOSIS — D2239 Melanocytic nevi of other parts of face: Secondary | ICD-10-CM | POA: Diagnosis not present

## 2019-03-17 DIAGNOSIS — M19042 Primary osteoarthritis, left hand: Secondary | ICD-10-CM | POA: Diagnosis not present

## 2019-05-23 DIAGNOSIS — J3089 Other allergic rhinitis: Secondary | ICD-10-CM | POA: Diagnosis not present

## 2019-05-23 DIAGNOSIS — J301 Allergic rhinitis due to pollen: Secondary | ICD-10-CM | POA: Diagnosis not present

## 2019-05-23 DIAGNOSIS — H1045 Other chronic allergic conjunctivitis: Secondary | ICD-10-CM | POA: Diagnosis not present

## 2019-06-09 DIAGNOSIS — M19042 Primary osteoarthritis, left hand: Secondary | ICD-10-CM | POA: Diagnosis not present

## 2019-06-09 DIAGNOSIS — M180 Bilateral primary osteoarthritis of first carpometacarpal joints: Secondary | ICD-10-CM | POA: Diagnosis not present

## 2019-06-25 ENCOUNTER — Other Ambulatory Visit: Payer: Self-pay | Admitting: Obstetrics and Gynecology

## 2019-06-25 DIAGNOSIS — M810 Age-related osteoporosis without current pathological fracture: Secondary | ICD-10-CM

## 2019-07-24 DIAGNOSIS — J3089 Other allergic rhinitis: Secondary | ICD-10-CM | POA: Diagnosis not present

## 2019-07-24 DIAGNOSIS — J301 Allergic rhinitis due to pollen: Secondary | ICD-10-CM | POA: Diagnosis not present

## 2019-07-29 ENCOUNTER — Ambulatory Visit
Admission: RE | Admit: 2019-07-29 | Discharge: 2019-07-29 | Disposition: A | Discharge status: Home / Self Care | Payer: Medicare Other | Source: Ambulatory Visit | Attending: Obstetrics and Gynecology | Admitting: Obstetrics and Gynecology

## 2019-07-29 ENCOUNTER — Other Ambulatory Visit: Payer: Self-pay

## 2019-07-29 DIAGNOSIS — M810 Age-related osteoporosis without current pathological fracture: Secondary | ICD-10-CM

## 2019-07-29 DIAGNOSIS — Z780 Asymptomatic menopausal state: Secondary | ICD-10-CM | POA: Diagnosis not present

## 2019-07-29 DIAGNOSIS — M8589 Other specified disorders of bone density and structure, multiple sites: Secondary | ICD-10-CM | POA: Diagnosis not present

## 2019-08-07 DIAGNOSIS — I10 Essential (primary) hypertension: Secondary | ICD-10-CM | POA: Diagnosis not present

## 2019-08-07 DIAGNOSIS — Z1331 Encounter for screening for depression: Secondary | ICD-10-CM | POA: Diagnosis not present

## 2019-08-07 DIAGNOSIS — Z23 Encounter for immunization: Secondary | ICD-10-CM | POA: Diagnosis not present

## 2019-08-07 DIAGNOSIS — Z9181 History of falling: Secondary | ICD-10-CM | POA: Diagnosis not present

## 2019-08-07 DIAGNOSIS — Z681 Body mass index (BMI) 19 or less, adult: Secondary | ICD-10-CM | POA: Diagnosis not present

## 2019-08-26 ENCOUNTER — Other Ambulatory Visit: Payer: Medicare Other

## 2019-09-05 DIAGNOSIS — M19042 Primary osteoarthritis, left hand: Secondary | ICD-10-CM | POA: Diagnosis not present

## 2019-10-17 ENCOUNTER — Other Ambulatory Visit: Payer: Self-pay | Admitting: Obstetrics and Gynecology

## 2019-10-17 DIAGNOSIS — Z1231 Encounter for screening mammogram for malignant neoplasm of breast: Secondary | ICD-10-CM

## 2019-10-18 DIAGNOSIS — S022XXA Fracture of nasal bones, initial encounter for closed fracture: Secondary | ICD-10-CM | POA: Diagnosis not present

## 2019-10-18 DIAGNOSIS — S0181XA Laceration without foreign body of other part of head, initial encounter: Secondary | ICD-10-CM | POA: Diagnosis not present

## 2019-10-24 DIAGNOSIS — S022XXA Fracture of nasal bones, initial encounter for closed fracture: Secondary | ICD-10-CM | POA: Diagnosis not present

## 2019-10-30 ENCOUNTER — Other Ambulatory Visit: Payer: Self-pay

## 2019-10-30 ENCOUNTER — Encounter (HOSPITAL_BASED_OUTPATIENT_CLINIC_OR_DEPARTMENT_OTHER): Payer: Self-pay | Admitting: Orthopedic Surgery

## 2019-11-10 ENCOUNTER — Other Ambulatory Visit: Payer: Self-pay | Admitting: Orthopedic Surgery

## 2019-11-19 ENCOUNTER — Other Ambulatory Visit: Payer: Self-pay

## 2019-11-19 ENCOUNTER — Encounter (HOSPITAL_BASED_OUTPATIENT_CLINIC_OR_DEPARTMENT_OTHER): Payer: Self-pay | Admitting: Orthopedic Surgery

## 2019-11-24 ENCOUNTER — Other Ambulatory Visit (HOSPITAL_COMMUNITY)
Admission: RE | Admit: 2019-11-24 | Discharge: 2019-11-24 | Disposition: A | Discharge status: Home / Self Care | Payer: Medicare Other | Source: Ambulatory Visit | Attending: Orthopedic Surgery | Admitting: Orthopedic Surgery

## 2019-11-24 ENCOUNTER — Encounter (HOSPITAL_BASED_OUTPATIENT_CLINIC_OR_DEPARTMENT_OTHER)
Admission: RE | Admit: 2019-11-24 | Discharge: 2019-11-24 | Disposition: A | Discharge status: Home / Self Care | Payer: Medicare Other | Source: Ambulatory Visit | Attending: Orthopedic Surgery | Admitting: Orthopedic Surgery

## 2019-11-24 ENCOUNTER — Other Ambulatory Visit: Payer: Self-pay

## 2019-11-24 DIAGNOSIS — Z01818 Encounter for other preprocedural examination: Secondary | ICD-10-CM | POA: Diagnosis not present

## 2019-11-24 DIAGNOSIS — M19042 Primary osteoarthritis, left hand: Secondary | ICD-10-CM | POA: Insufficient documentation

## 2019-11-24 DIAGNOSIS — Z20822 Contact with and (suspected) exposure to covid-19: Secondary | ICD-10-CM | POA: Insufficient documentation

## 2019-11-24 DIAGNOSIS — I10 Essential (primary) hypertension: Secondary | ICD-10-CM | POA: Diagnosis not present

## 2019-11-24 LAB — BASIC METABOLIC PANEL
Anion gap: 8 (ref 5–15)
BUN: 6 mg/dL — ABNORMAL LOW (ref 8–23)
CO2: 26 mmol/L (ref 22–32)
Calcium: 9.4 mg/dL (ref 8.9–10.3)
Chloride: 97 mmol/L — ABNORMAL LOW (ref 98–111)
Creatinine, Ser: 0.65 mg/dL (ref 0.44–1.00)
GFR calc Af Amer: >60 mL/min (ref >60)
GFR calc non Af Amer: >60 mL/min (ref >60)
Glucose, Bld: 94 mg/dL (ref 70–99)
Potassium: 4.4 mmol/L (ref 3.5–5.1)
Sodium: 131 mmol/L — ABNORMAL LOW (ref 135–145)

## 2019-11-24 NOTE — Progress Notes (Signed)

## 2019-11-25 LAB — NOVEL CORONAVIRUS, NAA (HOSP ORDER, SEND-OUT TO REF LAB; TAT 18-24 HRS): SARS-CoV-2, NAA: NOT DETECTED

## 2019-11-26 NOTE — Anesthesia Preprocedure Evaluation (Addendum)
Anesthesia Evaluation  Patient identified by MRN, date of birth, ID band Patient awake    Reviewed: Allergy & Precautions, H&P , NPO status , Patient's Chart, lab work & pertinent test results  Airway Mallampati: I       Dental no notable dental hx. (+) Teeth Intact   Pulmonary neg pulmonary ROS,    Pulmonary exam normal breath sounds clear to auscultation       Cardiovascular Exercise Tolerance: Good hypertension, Pt. on medications negative cardio ROS Normal cardiovascular exam Rhythm:Regular Rate:Normal     Neuro/Psych negative neurological ROS  negative psych ROS   GI/Hepatic Neg liver ROS,   Endo/Other  negative endocrine ROS  Renal/GU negative Renal ROS  negative genitourinary   Musculoskeletal  (+) Arthritis , Osteoarthritis,    Abdominal Normal abdominal exam  (+)   Peds  Hematology negative hematology ROS (+)   Anesthesia Other Findings   Reproductive/Obstetrics                            Anesthesia Physical  Anesthesia Plan  ASA: II  Anesthesia Plan: General   Post-op Pain Management:  Regional for Post-op pain   Induction: Intravenous  PONV Risk Score and Plan: 3 and Ondansetron, Dexamethasone, Treatment may vary due to age or medical condition and Midazolam  Airway Management Planned: LMA  Additional Equipment: None  Intra-op Plan:   Post-operative Plan: Extubation in OR  Informed Consent: I have reviewed the patients History and Physical, chart, labs and discussed the procedure including the risks, benefits and alternatives for the proposed anesthesia with the patient or authorized representative who has indicated his/her understanding and acceptance.     Dental advisory given  Plan Discussed with: CRNA  Anesthesia Plan Comments: ( )       Anesthesia Quick Evaluation

## 2019-11-27 ENCOUNTER — Encounter (HOSPITAL_BASED_OUTPATIENT_CLINIC_OR_DEPARTMENT_OTHER): Payer: Self-pay | Admitting: Orthopedic Surgery

## 2019-11-27 ENCOUNTER — Ambulatory Visit (HOSPITAL_BASED_OUTPATIENT_CLINIC_OR_DEPARTMENT_OTHER)
Admission: RE | Admit: 2019-11-27 | Discharge: 2019-11-27 | Disposition: A | Discharge status: Home / Self Care | Payer: Medicare Other | Attending: Orthopedic Surgery | Admitting: Orthopedic Surgery

## 2019-11-27 ENCOUNTER — Ambulatory Visit (HOSPITAL_BASED_OUTPATIENT_CLINIC_OR_DEPARTMENT_OTHER): Payer: Medicare Other | Admitting: Anesthesiology

## 2019-11-27 ENCOUNTER — Other Ambulatory Visit: Payer: Self-pay

## 2019-11-27 ENCOUNTER — Encounter (HOSPITAL_BASED_OUTPATIENT_CLINIC_OR_DEPARTMENT_OTHER)
Admission: RE | Disposition: A | Discharge status: Home / Self Care | Payer: Self-pay | Source: Home / Self Care | Attending: Orthopedic Surgery

## 2019-11-27 DIAGNOSIS — M19042 Primary osteoarthritis, left hand: Secondary | ICD-10-CM | POA: Insufficient documentation

## 2019-11-27 DIAGNOSIS — I10 Essential (primary) hypertension: Secondary | ICD-10-CM | POA: Diagnosis not present

## 2019-11-27 DIAGNOSIS — Z79899 Other long term (current) drug therapy: Secondary | ICD-10-CM | POA: Diagnosis not present

## 2019-11-27 DIAGNOSIS — K219 Gastro-esophageal reflux disease without esophagitis: Secondary | ICD-10-CM | POA: Diagnosis not present

## 2019-11-27 DIAGNOSIS — Z880 Allergy status to penicillin: Secondary | ICD-10-CM | POA: Diagnosis not present

## 2019-11-27 DIAGNOSIS — G8918 Other acute postprocedural pain: Secondary | ICD-10-CM | POA: Diagnosis not present

## 2019-11-27 DIAGNOSIS — M151 Heberden's nodes (with arthropathy): Secondary | ICD-10-CM | POA: Diagnosis not present

## 2019-11-27 HISTORY — PX: FINGER ARTHRODESIS: SHX5000

## 2019-11-27 SURGERY — FUSION, JOINT, FINGER
Anesthesia: General | Site: Finger | Laterality: Left

## 2019-11-27 MED ORDER — PROPOFOL 10 MG/ML IV BOLUS
INTRAVENOUS | Status: AC
  Filled 2019-11-27: qty 20

## 2019-11-27 MED ORDER — ROPIVACAINE HCL 7.5 MG/ML IJ SOLN
INTRAMUSCULAR | Status: DC | PRN
  Administered 2019-11-27 (×4): 5 mL via PERINEURAL

## 2019-11-27 MED ORDER — MIDAZOLAM HCL 2 MG/2ML IJ SOLN
INTRAMUSCULAR | Status: AC
  Filled 2019-11-27: qty 2

## 2019-11-27 MED ORDER — ONDANSETRON HCL 4 MG/2ML IJ SOLN: 4.0000 mg | Freq: Once | INTRAMUSCULAR | Status: DC | PRN

## 2019-11-27 MED ORDER — FENTANYL CITRATE (PF) 100 MCG/2ML IJ SOLN
50.0000 ug | INTRAMUSCULAR | Status: DC | PRN
  Administered 2019-11-27: 08:00:00 50 ug via INTRAVENOUS

## 2019-11-27 MED ORDER — ACETAMINOPHEN 325 MG PO TABS: 325.0000 mg | ORAL_TABLET | ORAL | Status: DC | PRN

## 2019-11-27 MED ORDER — VANCOMYCIN HCL IN DEXTROSE 1-5 GM/200ML-% IV SOLN: 1000.0000 mg | INTRAVENOUS | Status: DC

## 2019-11-27 MED ORDER — CHLORHEXIDINE GLUCONATE 4 % EX LIQD: 60.0000 mL | Freq: Once | CUTANEOUS | Status: DC

## 2019-11-27 MED ORDER — EPHEDRINE SULFATE-NACL 50-0.9 MG/10ML-% IV SOSY
PREFILLED_SYRINGE | INTRAVENOUS | Status: DC | PRN
  Administered 2019-11-27: 10 mg via INTRAVENOUS
  Administered 2019-11-27 (×4): 5 mg via INTRAVENOUS

## 2019-11-27 MED ORDER — KETOROLAC TROMETHAMINE 15 MG/ML IJ SOLN: 15.0000 mg | Freq: Once | INTRAMUSCULAR | Status: DC

## 2019-11-27 MED ORDER — VANCOMYCIN HCL IN DEXTROSE 1-5 GM/200ML-% IV SOLN
INTRAVENOUS | Status: AC
  Filled 2019-11-27: qty 200

## 2019-11-27 MED ORDER — ONDANSETRON HCL 4 MG/2ML IJ SOLN
INTRAMUSCULAR | Status: DC | PRN
  Administered 2019-11-27: 4 mg via INTRAVENOUS

## 2019-11-27 MED ORDER — FENTANYL CITRATE (PF) 100 MCG/2ML IJ SOLN: 25.0000 ug | INTRAMUSCULAR | Status: DC | PRN

## 2019-11-27 MED ORDER — LIDOCAINE 2% (20 MG/ML) 5 ML SYRINGE
INTRAMUSCULAR | Status: DC | PRN
  Administered 2019-11-27: 50 mg via INTRAVENOUS

## 2019-11-27 MED ORDER — PHENYLEPHRINE 40 MCG/ML (10ML) SYRINGE FOR IV PUSH (FOR BLOOD PRESSURE SUPPORT)
PREFILLED_SYRINGE | INTRAVENOUS | Status: DC | PRN
  Administered 2019-11-27: 40 ug via INTRAVENOUS

## 2019-11-27 MED ORDER — PROPOFOL 10 MG/ML IV BOLUS
INTRAVENOUS | Status: DC | PRN
  Administered 2019-11-27: 100 mg via INTRAVENOUS

## 2019-11-27 MED ORDER — CLONIDINE HCL (ANALGESIA) 100 MCG/ML EP SOLN
EPIDURAL | Status: DC | PRN
  Administered 2019-11-27: 100 ug

## 2019-11-27 MED ORDER — MEPERIDINE HCL 25 MG/ML IJ SOLN: 6.2500 mg | INTRAMUSCULAR | Status: DC | PRN

## 2019-11-27 MED ORDER — OXYCODONE HCL 5 MG PO TABS: 5.0000 mg | ORAL_TABLET | Freq: Once | ORAL | Status: DC | PRN

## 2019-11-27 MED ORDER — ACETAMINOPHEN 160 MG/5ML PO SOLN: 325.0000 mg | ORAL | Status: DC | PRN

## 2019-11-27 MED ORDER — FENTANYL CITRATE (PF) 100 MCG/2ML IJ SOLN
INTRAMUSCULAR | Status: AC
  Filled 2019-11-27: qty 2

## 2019-11-27 MED ORDER — DEXAMETHASONE SODIUM PHOSPHATE 10 MG/ML IJ SOLN
INTRAMUSCULAR | Status: DC | PRN
  Administered 2019-11-27: 5 mg via INTRAVENOUS

## 2019-11-27 MED ORDER — LACTATED RINGERS IV SOLN: INTRAVENOUS | Status: DC

## 2019-11-27 MED ORDER — MIDAZOLAM HCL 2 MG/2ML IJ SOLN
1.0000 mg | INTRAMUSCULAR | Status: DC | PRN
  Administered 2019-11-27: 08:00:00 1 mg via INTRAVENOUS

## 2019-11-27 MED ORDER — CEFAZOLIN SODIUM-DEXTROSE 2-3 GM-%(50ML) IV SOLR
INTRAVENOUS | Status: DC | PRN
  Administered 2019-11-27: 2 g via INTRAVENOUS

## 2019-11-27 MED ORDER — OXYCODONE HCL 5 MG/5ML PO SOLN: 5.0000 mg | Freq: Once | ORAL | Status: DC | PRN

## 2019-11-27 MED ORDER — TRAMADOL HCL 50 MG PO TABS: 50.0000 mg | ORAL_TABLET | Freq: Four times a day (QID) | ORAL | 0 refills | Status: DC | PRN

## 2019-11-27 MED ORDER — CEFAZOLIN SODIUM-DEXTROSE 2-4 GM/100ML-% IV SOLN
INTRAVENOUS | Status: AC
  Filled 2019-11-27: qty 100

## 2019-11-27 SURGICAL SUPPLY — 51 items
APL PRP STRL LF DISP 70% ISPRP (MISCELLANEOUS) ×1
BLADE MINI RND TIP GREEN BEAV (BLADE) ×2 IMPLANT
BLADE SURG 15 STRL LF DISP TIS (BLADE) ×1 IMPLANT
BLADE SURG 15 STRL SS (BLADE) ×2
BNDG CMPR 9X4 STRL LF SNTH (GAUZE/BANDAGES/DRESSINGS) ×1
BNDG COHESIVE 1X5 TAN STRL LF (GAUZE/BANDAGES/DRESSINGS) ×2 IMPLANT
BNDG COHESIVE 3X5 TAN STRL LF (GAUZE/BANDAGES/DRESSINGS) ×2 IMPLANT
BNDG ESMARK 4X9 LF (GAUZE/BANDAGES/DRESSINGS) ×2 IMPLANT
BNDG GAUZE ELAST 4 BULKY (GAUZE/BANDAGES/DRESSINGS) ×2 IMPLANT
CHLORAPREP W/TINT 26 (MISCELLANEOUS) ×2 IMPLANT
CORD BIPOLAR FORCEPS 12FT (ELECTRODE) ×2 IMPLANT
COVER BACK TABLE 60X90IN (DRAPES) ×2 IMPLANT
COVER MAYO STAND STRL (DRAPES) ×3 IMPLANT
COVER WAND RF STERILE (DRAPES) IMPLANT
CUFF TOURN SGL QUICK 18X4 (TOURNIQUET CUFF) ×1 IMPLANT
DRAPE EXTREMITY T 121X128X90 (DISPOSABLE) ×2 IMPLANT
DRAPE OEC MINIVIEW 54X84 (DRAPES) ×2 IMPLANT
DRAPE SURG 17X23 STRL (DRAPES) ×2 IMPLANT
GAUZE SPONGE 4X4 12PLY STRL (GAUZE/BANDAGES/DRESSINGS) ×2 IMPLANT
GAUZE XEROFORM 1X8 LF (GAUZE/BANDAGES/DRESSINGS) ×2 IMPLANT
GLOVE BIOGEL PI IND STRL 6.5 (GLOVE) IMPLANT
GLOVE BIOGEL PI IND STRL 8.5 (GLOVE) ×1 IMPLANT
GLOVE BIOGEL PI INDICATOR 6.5 (GLOVE) ×2
GLOVE BIOGEL PI INDICATOR 8.5 (GLOVE) ×1
GLOVE SURG ORTHO 8.0 STRL STRW (GLOVE) ×2 IMPLANT
GOWN STRL REUS W/ TWL LRG LVL3 (GOWN DISPOSABLE) ×1 IMPLANT
GOWN STRL REUS W/TWL LRG LVL3 (GOWN DISPOSABLE) ×6
GOWN STRL REUS W/TWL XL LVL3 (GOWN DISPOSABLE) ×2 IMPLANT
K-WIRE 0.35X4 (WIRE) ×2
K-WIRE PROS .028 4 (WIRE) ×2 IMPLANT
KWIRE 0.35X4 (WIRE) IMPLANT
NS IRRIG 1000ML POUR BTL (IV SOLUTION) ×2 IMPLANT
PACK BASIN DAY SURGERY FS (CUSTOM PROCEDURE TRAY) ×2 IMPLANT
PAD CAST 3X4 CTTN HI CHSV (CAST SUPPLIES) ×1 IMPLANT
PADDING CAST ABS 3INX4YD NS (CAST SUPPLIES)
PADDING CAST ABS 4INX4YD NS (CAST SUPPLIES) ×1
PADDING CAST ABS COTTON 3X4 (CAST SUPPLIES) IMPLANT
PADDING CAST ABS COTTON 4X4 ST (CAST SUPPLIES) ×1 IMPLANT
PADDING CAST COTTON 3X4 STRL (CAST SUPPLIES) ×2
SLEEVE SCD COMPRESS KNEE MED (MISCELLANEOUS) ×1 IMPLANT
SLING ARM FOAM STRAP SML (SOFTGOODS) ×1 IMPLANT
SPLINT FINGER 3.25 911903 (SOFTGOODS) ×1 IMPLANT
SPLINT PLASTER CAST XFAST 3X15 (CAST SUPPLIES) ×10 IMPLANT
SPLINT PLASTER XTRA FASTSET 3X (CAST SUPPLIES) ×10
STOCKINETTE 4X48 STRL (DRAPES) ×2 IMPLANT
SUT ETHILON 4 0 PS 2 18 (SUTURE) ×2 IMPLANT
SUT VICRYL 4-0 PS2 18IN ABS (SUTURE) ×2 IMPLANT
SYR BULB 3OZ (MISCELLANEOUS) ×2 IMPLANT
SYR CONTROL 10ML LL (SYRINGE) IMPLANT
TOWEL GREEN STERILE FF (TOWEL DISPOSABLE) ×2 IMPLANT
UNDERPAD 30X36 HEAVY ABSORB (UNDERPADS AND DIAPERS) ×2 IMPLANT

## 2019-11-27 NOTE — Transfer of Care (Signed)
Immediate Anesthesia Transfer of Care Note  Patient: Brandy Davis  Procedure(s) Performed: ARTHRODESIS LEFT INDEX LEFT SMALL FINGER, DISTAL INTERPHALANGEAL JOINTS WITH PINNING (Left Finger)  Patient Location: PACU  Anesthesia Type:General and Regional  Level of Consciousness: drowsy  Airway & Oxygen Therapy: Patient Spontanous Breathing and Patient connected to nasal cannula oxygen  Post-op Assessment: Report given to RN and Post -op Vital signs reviewed and stable  Post vital signs: Reviewed and stable  Last Vitals:  Vitals Value Taken Time  BP 113/65 11/27/19 1017  Temp    Pulse 73 11/27/19 1020  Resp 17 11/27/19 1020  SpO2 100 % 11/27/19 1020  Vitals shown include unvalidated device data.  Last Pain:  Vitals:   11/27/19 0738  TempSrc: Temporal  PainSc: 0-No pain         Complications: No apparent anesthesia complications

## 2019-11-27 NOTE — Anesthesia Procedure Notes (Signed)
Procedure Name: LMA Insertion Date/Time: 11/27/2019 8:47 AM Performed by: Gwyndolyn Saxon, CRNA Pre-anesthesia Checklist: Patient identified, Emergency Drugs available, Suction available and Patient being monitored Patient Re-evaluated:Patient Re-evaluated prior to induction Oxygen Delivery Method: Circle system utilized Preoxygenation: Pre-oxygenation with 100% oxygen Induction Type: IV induction Ventilation: Mask ventilation without difficulty LMA: LMA inserted LMA Size: 3.0 Number of attempts: 1 Placement Confirmation: positive ETCO2 and breath sounds checked- equal and bilateral Tube secured with: Tape Dental Injury: Teeth and Oropharynx as per pre-operative assessment

## 2019-11-27 NOTE — Progress Notes (Signed)
Assisted Dr. Hatchett with left, ultrasound guided, supraclavicular block. Side rails up, monitors on throughout procedure. See vital signs in flow sheet. Tolerated Procedure well. 

## 2019-11-27 NOTE — Discharge Instructions (Addendum)

## 2019-11-27 NOTE — H&P (Signed)
  Brandy Davis is an 69 y.o. female.   Chief Complaint: pain finger joints distal left index and small HPI: Brandy Davis is a 69 yo female with arthritis left hand primarily DIP joints index and small fingers. She  has been taking meloxicam which has helped. She ran out and her pain has increased sharp in nature 6 7-8 over 10 to the DIP joints of each of those digits. She has been wearing splints. She has no history of diabetes thyroid problems or gout. She has history of arthritis. Family history is positive diabetes negative for the remainder.    Past Medical History:  Diagnosis Date  . Arthritis of carpometacarpal [facility]) joint of right thumb 04/2018  . GERD (gastroesophageal reflux disease)    diet-controlled  . Hypertension    states under control with med., has been on med. since 2014  . Seasonal allergies     Past Surgical History:  Procedure Laterality Date  . CARPOMETACARPEL SUSPENSION PLASTY Right 05/02/2018   Procedure: CARPOMETACARPAL ([place]) SUSPENSIONPLASTY RIGHT THUMB, EXCISION TRAPEZIUM;  Surgeon: Daryll Brod, MD;  Location: [facility];  Service: Orthopedics;  Laterality: Right;  . LASIK    . TENDON TRANSFER Right 05/02/2018   Procedure: ABDUCTOR POLLICIS LONGUS TRANSFER;  Surgeon: Daryll Brod, MD;  Location: [place];  Service: Orthopedics;  Laterality: Right;  . TONSILLECTOMY     age 26    History reviewed. No pertinent family history. Social History:  reports that she has never smoked. She has never used smokeless tobacco. She reports current alcohol use. She reports that she does not use drugs.  Allergies:  Allergies  Allergen Reactions  . Penicillins Other (See Comments)    UNKNOWN - AS A CHILD    No medications prior to admission.    No results found for this or any previous visit (from the past 48 hour(s)).  No results found.   Pertinent items are noted in HPI.  Height 5\' 3"  (1.6 m), weight 43.1 kg.  General appearance: alert,  cooperative and appears stated age Head: Normocephalic, without obvious abnormality Neck: no JVD Resp: clear to auscultation bilaterally Cardio: regular rate and rhythm, S1, S2 normal, no murmur, click, rub or gallop GI: soft, non-tender; bowel sounds normal; no masses,  no organomegaly Extremities:  pain finger joints distal interphalangeal  left index and small Pulses: 2+ and symmetric Skin: Skin color, texture, turgor normal. No rashes or lesions Neurologic: Grossly normal Incision/Wound: na  Assessment/Plan Assessment:  1. 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 Not certain there were going to be able to put screws in these. This may require pinning and tension band wiring. Preperi-and postoperative course are discussed along with risk and complications. She is aware there is no guarantee to the surgery possibility of infection recurrence injury to arteries nerves tendons complete relief symptoms dystrophy the possibility of nonunion. She is scheduled as an outpatient under regional anesthesia fusion distal phalangeal joints index small fingers left hand.    Daryll Brod 11/27/2019, 5:49 AM

## 2019-11-27 NOTE — Brief Op Note (Signed)
11/27/2019  10:12 AM  PATIENT:  Brandy Davis  69 y.o. female  PRE-OPERATIVE DIAGNOSIS:  DEGENERATIVE OSTEOARTHRITIS LEFT INDEX LEFT SMALL FINGER DISTAL INTERPHALANGEAL JOINTS  POST-OPERATIVE DIAGNOSIS:  DEGENERATIVE OSTEOARTHRITIS LEFT INDEX LEFT SMALL FINGER DISTAL INTERPHALANGEAL JOINTS  PROCEDURE:  Procedure(s) with comments: ARTHRODESIS LEFT INDEX LEFT SMALL FINGER, DISTAL INTERPHALANGEAL JOINTS WITH PINNING (Left) - AXILLARY  SURGEON:  Surgeon(s) and Role:    * Daryll Brod, MD - Primary    * Leanora Cover, MD - Assisting  PHYSICIAN ASSISTANT:   ASSISTANTS: K Kayton Dunaj,MD   ANESTHESIA:   regional and IV sedation  EBL:62ml BLOOD ADMINISTERED:none  DRAINS: none   LOCAL MEDICATIONS USED:  NONE  SPECIMEN:  No Specimen  DISPOSITION OF SPECIMEN:  N/A  COUNTS:  YES  TOURNIQUET:   Total Tourniquet Time Documented: Upper Arm (Left) - 68 minutes Total: Upper Arm (Left) - 68 minutes   DICTATION: .Viviann Spare Dictation  PLAN OF CARE: Discharge to home after PACU  PATIENT DISPOSITION:  PACU - hemodynamically stable.

## 2019-11-27 NOTE — Anesthesia Postprocedure Evaluation (Signed)
Anesthesia Post Note  Patient: Brandy Davis  Procedure(s) Performed: ARTHRODESIS LEFT INDEX LEFT SMALL FINGER, DISTAL INTERPHALANGEAL JOINTS WITH PINNING (Left Finger)     Patient location during evaluation: Phase II Anesthesia Type: General Level of consciousness: awake Pain management: pain level controlled Vital Signs Assessment: post-procedure vital signs reviewed and stable Respiratory status: spontaneous breathing Cardiovascular status: stable Postop Assessment: no apparent nausea or vomiting Anesthetic complications: no    Last Vitals:  Vitals:   11/27/19 1045 11/27/19 1100  BP:  117/71  Pulse: 75 92  Resp: 13 14  Temp:  36.4 C  SpO2: 100% 100%    Last Pain:  Vitals:   11/27/19 1100  TempSrc:   PainSc: 0-No pain   Pain Goal:                   Huston Foley

## 2019-11-27 NOTE — Op Note (Signed)
I assisted Surgeon(s) and Role:    * Daryll Brod, MD - Primary    * Leanora Cover, MD - Assisting on the Procedure(s): ARTHRODESIS LEFT INDEX LEFT SMALL FINGER, [place] on 11/27/2019.  I provided assistance on this case as follows: retraction soft tissues, placement hardware.  Electronically signed by: Leanora Cover, MD Date: 11/27/2019 Time: 10:15 AM

## 2019-11-27 NOTE — Anesthesia Procedure Notes (Signed)
Anesthesia Regional Block: Supraclavicular block   Pre-Anesthetic Checklist: ,, timeout performed, Correct Patient, Correct Site, Correct Laterality, Correct Procedure, Correct Position, site marked, Risks and benefits discussed,  Surgical consent,  Pre-op evaluation,  At surgeon's request and post-op pain management  Laterality: Upper and Left  Prep: chloraprep       Needles:  Injection technique: Single-shot  Needle Type: Echogenic Stimulator Needle     Needle Length: 10cm  Needle Gauge: 21   Needle insertion depth: 1 cm   Additional Needles:   Procedures:,,,, ultrasound used (permanent image in chart),,,,  Narrative:  Start time: 11/27/2019 8:10 AM End time: 11/27/2019 8:18 AM Injection made incrementally with aspirations every 5 mL.  Performed by: Personally  Anesthesiologist: Lyn Hollingshead, MD

## 2019-11-27 NOTE — Op Note (Signed)
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   ASSISTANT: Leanora Cover, MD   ANESTHESIA:  Regional with sedation   INTRAVENOUS FLUIDS:  Per anesthesia flow sheet.   ESTIMATED BLOOD LOSS:  Minimal.   COMPLICATIONS:  None.   SPECIMENS:  none   TOURNIQUET TIME:    Total Tourniquet Time Documented: Upper Arm (Left) - 68 minutes Total: Upper Arm (Left) - 68 minutes    DISPOSITION:  Stable to PACU.   INDICATIONS: Patient is a [age]-year-old female with a long history of degenerative arthritis many fingers both hands she has had particular pain in the index and small fingers distal phalangeal joints and has not responded to conservative treatment.  She has elected undergo fusions of those 2 joints.  Preperi-and postoperative course are discussed along with risks and complications.  She is aware that there is no guarantee to the surgery the possibility of infection recurrence injury to arteries nerves tendons complete relief symptoms dystrophy the possibility of nonunion.  In the preoperative area the patient is seen the extremity marked by both patient and surgeon antibiotic given.  A supraclavicular block was carried out without difficulty in the preoperative area under the direction of the anesthesia department.  OPERATIVE COURSE: Patient is brought to the operating room placed in the supine position prepped draped using ChloraPrep and in a 3-minute dry time allowed.  A timeout was taken to confirm patient procedure.  The limb was exsanguinated with an Esmarch bandage turn placed  on the arm was inflated to 250 mmHg.  The index finger was attended to first.  An H incision was made over the distal phalangeal joint carried down through subcutaneous tissue.  Bleeders were electrocauterized necessary with bipolar.  The extensor tendon was transected.  This allowed opening the joint which was markedly arthritic.  Rongeurs were then used to remove the remainder of the articular surface and subchondral bone of the middle phalanx the distal phalanx articular surface to been entirely destroyed this was then shaped by removing the subchondral bone.  The wound was irrigated.  Image intensification x-rays at the joint could be brought together and compressed manually.  The bone was too small for application of a intramedullary screw which was placed against the distal phalanx and was larger than the distal phalanx.  2 .028 K wires were then placed in a antegrade manner through the distal phalanx position confirmed with image intensification.  These were placed so as to not be parallel.  The joint surfaces were then aligned and the 2 .028 K wires were then advanced into the middle phalanx and confirmed with image intensification.  The pins were bent and cut short.  The wound was copious irrigated with saline and closed with interrupted 4-0 nylon sutures.  The small finger was attended to next.  An H incision was made over the dorsal aspect of the DIP joint carried down through subcutaneous tissue.  Bleeders again electrocauterized necessary with bipolar the extensor tendon was then transected allowing visualization of the tendon entirely destroyed distal phalangeal joint with no normal architecture of  the distal portion of the middle phalanx.  The rondure was then used to shape this.  The distal phalanx had the subchondral bone entirely eroded through.  This was shaped with the rondure.  A single 0.035 K wire was then inserted in the distal phalanx and barely fit through the medullary canal which was  confirmed with image intensification.  A drill hole was placed in the middle phalanx and the joint reduced aligned and the pin driven proximally across the middle phalanx this was confirmed with image intensification.  The wound was copious irrigated with saline.  The skin was closed interrupted 4-0 nylon sutures.  Sterile compressive dressings and splints were applied to each after cutting the pin short bending him over.  I deflation of the tourniquet remaining fingers pinked and she was taken to the recovery room for observation in satisfactory condition.  She will be discharged home to return to hand center [facility] in 1 week Tylenol ibuprofen for pain with Ultram for breakthrough.   Daryll Brod, MD Electronically signed, 11/27/19

## 2019-11-28 ENCOUNTER — Encounter: Payer: Self-pay | Admitting: *Deleted

## 2019-12-05 DIAGNOSIS — M19042 Primary osteoarthritis, left hand: Secondary | ICD-10-CM | POA: Diagnosis not present

## 2019-12-11 ENCOUNTER — Ambulatory Visit
Admission: RE | Admit: 2019-12-11 | Discharge: 2019-12-11 | Disposition: A | Discharge status: Home / Self Care | Payer: Medicare Other | Source: Ambulatory Visit | Attending: Obstetrics and Gynecology | Admitting: Obstetrics and Gynecology

## 2019-12-11 ENCOUNTER — Other Ambulatory Visit: Payer: Self-pay

## 2019-12-11 DIAGNOSIS — Z1231 Encounter for screening mammogram for malignant neoplasm of breast: Secondary | ICD-10-CM | POA: Diagnosis not present

## 2019-12-29 DIAGNOSIS — H04123 Dry eye syndrome of bilateral lacrimal glands: Secondary | ICD-10-CM | POA: Diagnosis not present

## 2019-12-29 DIAGNOSIS — H40003 Preglaucoma, unspecified, bilateral: Secondary | ICD-10-CM | POA: Diagnosis not present

## 2019-12-29 DIAGNOSIS — H43811 Vitreous degeneration, right eye: Secondary | ICD-10-CM | POA: Diagnosis not present

## 2020-01-05 DIAGNOSIS — M180 Bilateral primary osteoarthritis of first carpometacarpal joints: Secondary | ICD-10-CM | POA: Diagnosis not present

## 2020-01-14 DIAGNOSIS — M19042 Primary osteoarthritis, left hand: Secondary | ICD-10-CM | POA: Diagnosis not present

## 2020-02-11 DIAGNOSIS — Z981 Arthrodesis status: Secondary | ICD-10-CM | POA: Diagnosis not present

## 2020-02-11 DIAGNOSIS — M19042 Primary osteoarthritis, left hand: Secondary | ICD-10-CM | POA: Diagnosis not present

## 2020-03-10 DIAGNOSIS — M79645 Pain in left finger(s): Secondary | ICD-10-CM | POA: Diagnosis not present

## 2020-03-10 DIAGNOSIS — M9689 Other intraoperative and postprocedural complications and disorders of the musculoskeletal system: Secondary | ICD-10-CM | POA: Diagnosis not present

## 2020-03-10 DIAGNOSIS — M25642 Stiffness of left hand, not elsewhere classified: Secondary | ICD-10-CM | POA: Diagnosis not present

## 2020-03-10 DIAGNOSIS — R52 Pain, unspecified: Secondary | ICD-10-CM | POA: Diagnosis not present

## 2020-03-10 DIAGNOSIS — S022XXD Fracture of nasal bones, subsequent encounter for fracture with routine healing: Secondary | ICD-10-CM | POA: Diagnosis not present

## 2020-03-10 DIAGNOSIS — M19042 Primary osteoarthritis, left hand: Secondary | ICD-10-CM | POA: Diagnosis not present

## 2020-03-10 DIAGNOSIS — Q7002 Fused fingers, left hand: Secondary | ICD-10-CM | POA: Diagnosis not present

## 2020-03-10 DIAGNOSIS — M180 Bilateral primary osteoarthritis of first carpometacarpal joints: Secondary | ICD-10-CM | POA: Diagnosis not present

## 2020-03-23 DIAGNOSIS — D225 Melanocytic nevi of trunk: Secondary | ICD-10-CM | POA: Diagnosis not present

## 2020-03-23 DIAGNOSIS — L578 Other skin changes due to chronic exposure to nonionizing radiation: Secondary | ICD-10-CM | POA: Diagnosis not present

## 2020-03-23 DIAGNOSIS — D2239 Melanocytic nevi of other parts of face: Secondary | ICD-10-CM | POA: Diagnosis not present

## 2020-03-23 DIAGNOSIS — D1801 Hemangioma of skin and subcutaneous tissue: Secondary | ICD-10-CM | POA: Diagnosis not present

## 2020-03-23 DIAGNOSIS — L570 Actinic keratosis: Secondary | ICD-10-CM | POA: Diagnosis not present

## 2020-03-26 DIAGNOSIS — M9689 Other intraoperative and postprocedural complications and disorders of the musculoskeletal system: Secondary | ICD-10-CM | POA: Diagnosis not present

## 2020-03-26 DIAGNOSIS — M19042 Primary osteoarthritis, left hand: Secondary | ICD-10-CM | POA: Diagnosis not present

## 2020-04-15 DIAGNOSIS — M79671 Pain in right foot: Secondary | ICD-10-CM | POA: Diagnosis not present

## 2020-04-16 DIAGNOSIS — S92414A Nondisplaced fracture of proximal phalanx of right great toe, initial encounter for closed fracture: Secondary | ICD-10-CM | POA: Diagnosis not present

## 2020-04-16 DIAGNOSIS — S93491A Sprain of other ligament of right ankle, initial encounter: Secondary | ICD-10-CM | POA: Diagnosis not present

## 2020-04-21 DIAGNOSIS — M9689 Other intraoperative and postprocedural complications and disorders of the musculoskeletal system: Secondary | ICD-10-CM | POA: Diagnosis not present

## 2020-04-21 DIAGNOSIS — M180 Bilateral primary osteoarthritis of first carpometacarpal joints: Secondary | ICD-10-CM | POA: Diagnosis not present

## 2020-04-21 DIAGNOSIS — M19042 Primary osteoarthritis, left hand: Secondary | ICD-10-CM | POA: Diagnosis not present

## 2020-05-20 DIAGNOSIS — J301 Allergic rhinitis due to pollen: Secondary | ICD-10-CM | POA: Diagnosis not present

## 2020-05-20 DIAGNOSIS — J3089 Other allergic rhinitis: Secondary | ICD-10-CM | POA: Diagnosis not present

## 2020-05-21 DIAGNOSIS — M25642 Stiffness of left hand, not elsewhere classified: Secondary | ICD-10-CM | POA: Diagnosis not present

## 2020-05-21 DIAGNOSIS — M9689 Other intraoperative and postprocedural complications and disorders of the musculoskeletal system: Secondary | ICD-10-CM | POA: Diagnosis not present

## 2020-05-21 DIAGNOSIS — M19042 Primary osteoarthritis, left hand: Secondary | ICD-10-CM | POA: Diagnosis not present

## 2020-05-21 DIAGNOSIS — Q7002 Fused fingers, left hand: Secondary | ICD-10-CM | POA: Diagnosis not present

## 2020-05-21 DIAGNOSIS — M79645 Pain in left finger(s): Secondary | ICD-10-CM | POA: Diagnosis not present

## 2020-05-27 DIAGNOSIS — J302 Other seasonal allergic rhinitis: Secondary | ICD-10-CM | POA: Diagnosis not present

## 2020-05-27 DIAGNOSIS — Z681 Body mass index (BMI) 19 or less, adult: Secondary | ICD-10-CM | POA: Diagnosis not present

## 2020-05-27 DIAGNOSIS — H6120 Impacted cerumen, unspecified ear: Secondary | ICD-10-CM | POA: Diagnosis not present

## 2020-05-28 DIAGNOSIS — S92414D Nondisplaced fracture of proximal phalanx of right great toe, subsequent encounter for fracture with routine healing: Secondary | ICD-10-CM | POA: Diagnosis not present

## 2020-05-28 DIAGNOSIS — S93491A Sprain of other ligament of right ankle, initial encounter: Secondary | ICD-10-CM | POA: Diagnosis not present

## 2020-06-04 DIAGNOSIS — H1045 Other chronic allergic conjunctivitis: Secondary | ICD-10-CM | POA: Diagnosis not present

## 2020-06-04 DIAGNOSIS — M9689 Other intraoperative and postprocedural complications and disorders of the musculoskeletal system: Secondary | ICD-10-CM | POA: Diagnosis not present

## 2020-06-04 DIAGNOSIS — J301 Allergic rhinitis due to pollen: Secondary | ICD-10-CM | POA: Diagnosis not present

## 2020-06-04 DIAGNOSIS — M19042 Primary osteoarthritis, left hand: Secondary | ICD-10-CM | POA: Diagnosis not present

## 2020-06-04 DIAGNOSIS — J3089 Other allergic rhinitis: Secondary | ICD-10-CM | POA: Diagnosis not present

## 2020-06-11 DIAGNOSIS — M79645 Pain in left finger(s): Secondary | ICD-10-CM | POA: Diagnosis not present

## 2020-06-11 DIAGNOSIS — M25642 Stiffness of left hand, not elsewhere classified: Secondary | ICD-10-CM | POA: Diagnosis not present

## 2020-06-11 DIAGNOSIS — M180 Bilateral primary osteoarthritis of first carpometacarpal joints: Secondary | ICD-10-CM | POA: Diagnosis not present

## 2020-06-11 DIAGNOSIS — M9689 Other intraoperative and postprocedural complications and disorders of the musculoskeletal system: Secondary | ICD-10-CM | POA: Diagnosis not present

## 2020-06-11 DIAGNOSIS — Q7002 Fused fingers, left hand: Secondary | ICD-10-CM | POA: Diagnosis not present

## 2020-06-11 DIAGNOSIS — R52 Pain, unspecified: Secondary | ICD-10-CM | POA: Diagnosis not present

## 2020-06-11 DIAGNOSIS — M19042 Primary osteoarthritis, left hand: Secondary | ICD-10-CM | POA: Diagnosis not present

## 2020-07-02 DIAGNOSIS — M9689 Other intraoperative and postprocedural complications and disorders of the musculoskeletal system: Secondary | ICD-10-CM | POA: Diagnosis not present

## 2020-07-02 DIAGNOSIS — M180 Bilateral primary osteoarthritis of first carpometacarpal joints: Secondary | ICD-10-CM | POA: Diagnosis not present

## 2020-07-02 DIAGNOSIS — M25642 Stiffness of left hand, not elsewhere classified: Secondary | ICD-10-CM | POA: Diagnosis not present

## 2020-07-02 DIAGNOSIS — M19042 Primary osteoarthritis, left hand: Secondary | ICD-10-CM | POA: Diagnosis not present

## 2020-07-02 DIAGNOSIS — M25641 Stiffness of right hand, not elsewhere classified: Secondary | ICD-10-CM | POA: Diagnosis not present

## 2020-07-02 DIAGNOSIS — R52 Pain, unspecified: Secondary | ICD-10-CM | POA: Diagnosis not present

## 2020-07-02 DIAGNOSIS — Q7002 Fused fingers, left hand: Secondary | ICD-10-CM | POA: Diagnosis not present

## 2020-07-02 DIAGNOSIS — M79645 Pain in left finger(s): Secondary | ICD-10-CM | POA: Diagnosis not present

## 2020-07-21 DIAGNOSIS — M9689 Other intraoperative and postprocedural complications and disorders of the musculoskeletal system: Secondary | ICD-10-CM | POA: Diagnosis not present

## 2020-08-11 DIAGNOSIS — M1990 Unspecified osteoarthritis, unspecified site: Secondary | ICD-10-CM | POA: Diagnosis not present

## 2020-08-11 DIAGNOSIS — Z79899 Other long term (current) drug therapy: Secondary | ICD-10-CM | POA: Diagnosis not present

## 2020-08-11 DIAGNOSIS — R5383 Other fatigue: Secondary | ICD-10-CM | POA: Diagnosis not present

## 2020-08-11 DIAGNOSIS — Z1331 Encounter for screening for depression: Secondary | ICD-10-CM | POA: Diagnosis not present

## 2020-08-11 DIAGNOSIS — M8589 Other specified disorders of bone density and structure, multiple sites: Secondary | ICD-10-CM | POA: Diagnosis not present

## 2020-08-11 DIAGNOSIS — E781 Pure hyperglyceridemia: Secondary | ICD-10-CM | POA: Diagnosis not present

## 2020-08-11 DIAGNOSIS — E871 Hypo-osmolality and hyponatremia: Secondary | ICD-10-CM | POA: Diagnosis not present

## 2020-08-11 DIAGNOSIS — Z0000 Encounter for general adult medical examination without abnormal findings: Secondary | ICD-10-CM | POA: Diagnosis not present

## 2020-08-11 DIAGNOSIS — Z23 Encounter for immunization: Secondary | ICD-10-CM | POA: Diagnosis not present

## 2020-08-11 DIAGNOSIS — E559 Vitamin D deficiency, unspecified: Secondary | ICD-10-CM | POA: Diagnosis not present

## 2020-08-11 DIAGNOSIS — M8580 Other specified disorders of bone density and structure, unspecified site: Secondary | ICD-10-CM | POA: Diagnosis not present

## 2020-08-11 DIAGNOSIS — Z13220 Encounter for screening for lipoid disorders: Secondary | ICD-10-CM | POA: Diagnosis not present

## 2020-08-20 DIAGNOSIS — M9689 Other intraoperative and postprocedural complications and disorders of the musculoskeletal system: Secondary | ICD-10-CM | POA: Diagnosis not present

## 2020-09-06 DIAGNOSIS — M9689 Other intraoperative and postprocedural complications and disorders of the musculoskeletal system: Secondary | ICD-10-CM | POA: Diagnosis not present

## 2020-09-15 DIAGNOSIS — M9689 Other intraoperative and postprocedural complications and disorders of the musculoskeletal system: Secondary | ICD-10-CM | POA: Diagnosis not present

## 2020-09-20 DIAGNOSIS — M79642 Pain in left hand: Secondary | ICD-10-CM | POA: Diagnosis not present

## 2020-09-20 DIAGNOSIS — M25442 Effusion, left hand: Secondary | ICD-10-CM | POA: Diagnosis not present

## 2020-09-20 DIAGNOSIS — M25642 Stiffness of left hand, not elsewhere classified: Secondary | ICD-10-CM | POA: Diagnosis not present

## 2020-09-20 DIAGNOSIS — M1811 Unilateral primary osteoarthritis of first carpometacarpal joint, right hand: Secondary | ICD-10-CM | POA: Diagnosis not present

## 2020-09-22 DIAGNOSIS — M79642 Pain in left hand: Secondary | ICD-10-CM | POA: Diagnosis not present

## 2020-09-22 DIAGNOSIS — M25642 Stiffness of left hand, not elsewhere classified: Secondary | ICD-10-CM | POA: Diagnosis not present

## 2020-09-22 DIAGNOSIS — M1811 Unilateral primary osteoarthritis of first carpometacarpal joint, right hand: Secondary | ICD-10-CM | POA: Diagnosis not present

## 2020-09-22 DIAGNOSIS — M25442 Effusion, left hand: Secondary | ICD-10-CM | POA: Diagnosis not present

## 2020-09-24 DIAGNOSIS — M79642 Pain in left hand: Secondary | ICD-10-CM | POA: Diagnosis not present

## 2020-09-24 DIAGNOSIS — M25442 Effusion, left hand: Secondary | ICD-10-CM | POA: Diagnosis not present

## 2020-09-24 DIAGNOSIS — M1811 Unilateral primary osteoarthritis of first carpometacarpal joint, right hand: Secondary | ICD-10-CM | POA: Diagnosis not present

## 2020-09-24 DIAGNOSIS — M25642 Stiffness of left hand, not elsewhere classified: Secondary | ICD-10-CM | POA: Diagnosis not present

## 2020-09-28 DIAGNOSIS — M25642 Stiffness of left hand, not elsewhere classified: Secondary | ICD-10-CM | POA: Diagnosis not present

## 2020-09-28 DIAGNOSIS — M79642 Pain in left hand: Secondary | ICD-10-CM | POA: Diagnosis not present

## 2020-09-28 DIAGNOSIS — M25442 Effusion, left hand: Secondary | ICD-10-CM | POA: Diagnosis not present

## 2020-09-28 DIAGNOSIS — M1811 Unilateral primary osteoarthritis of first carpometacarpal joint, right hand: Secondary | ICD-10-CM | POA: Diagnosis not present

## 2020-10-04 DIAGNOSIS — M79642 Pain in left hand: Secondary | ICD-10-CM | POA: Diagnosis not present

## 2020-10-04 DIAGNOSIS — M25642 Stiffness of left hand, not elsewhere classified: Secondary | ICD-10-CM | POA: Diagnosis not present

## 2020-10-04 DIAGNOSIS — M25442 Effusion, left hand: Secondary | ICD-10-CM | POA: Diagnosis not present

## 2020-10-04 DIAGNOSIS — M1811 Unilateral primary osteoarthritis of first carpometacarpal joint, right hand: Secondary | ICD-10-CM | POA: Diagnosis not present

## 2020-10-07 DIAGNOSIS — M79642 Pain in left hand: Secondary | ICD-10-CM | POA: Diagnosis not present

## 2020-10-07 DIAGNOSIS — M25442 Effusion, left hand: Secondary | ICD-10-CM | POA: Diagnosis not present

## 2020-10-07 DIAGNOSIS — M25642 Stiffness of left hand, not elsewhere classified: Secondary | ICD-10-CM | POA: Diagnosis not present

## 2020-10-07 DIAGNOSIS — M1811 Unilateral primary osteoarthritis of first carpometacarpal joint, right hand: Secondary | ICD-10-CM | POA: Diagnosis not present

## 2020-10-11 DIAGNOSIS — M1811 Unilateral primary osteoarthritis of first carpometacarpal joint, right hand: Secondary | ICD-10-CM | POA: Diagnosis not present

## 2020-10-11 DIAGNOSIS — M79642 Pain in left hand: Secondary | ICD-10-CM | POA: Diagnosis not present

## 2020-10-11 DIAGNOSIS — M25442 Effusion, left hand: Secondary | ICD-10-CM | POA: Diagnosis not present

## 2020-10-11 DIAGNOSIS — M25642 Stiffness of left hand, not elsewhere classified: Secondary | ICD-10-CM | POA: Diagnosis not present

## 2020-10-14 DIAGNOSIS — M25642 Stiffness of left hand, not elsewhere classified: Secondary | ICD-10-CM | POA: Diagnosis not present

## 2020-10-14 DIAGNOSIS — M79642 Pain in left hand: Secondary | ICD-10-CM | POA: Diagnosis not present

## 2020-10-14 DIAGNOSIS — M1811 Unilateral primary osteoarthritis of first carpometacarpal joint, right hand: Secondary | ICD-10-CM | POA: Diagnosis not present

## 2020-10-14 DIAGNOSIS — M25442 Effusion, left hand: Secondary | ICD-10-CM | POA: Diagnosis not present

## 2020-10-15 DIAGNOSIS — M19042 Primary osteoarthritis, left hand: Secondary | ICD-10-CM | POA: Diagnosis not present

## 2020-10-15 DIAGNOSIS — M9689 Other intraoperative and postprocedural complications and disorders of the musculoskeletal system: Secondary | ICD-10-CM | POA: Diagnosis not present

## 2020-10-18 DIAGNOSIS — M79642 Pain in left hand: Secondary | ICD-10-CM | POA: Diagnosis not present

## 2020-10-18 DIAGNOSIS — M25642 Stiffness of left hand, not elsewhere classified: Secondary | ICD-10-CM | POA: Diagnosis not present

## 2020-10-18 DIAGNOSIS — M1811 Unilateral primary osteoarthritis of first carpometacarpal joint, right hand: Secondary | ICD-10-CM | POA: Diagnosis not present

## 2020-10-18 DIAGNOSIS — M25442 Effusion, left hand: Secondary | ICD-10-CM | POA: Diagnosis not present

## 2020-10-21 DIAGNOSIS — M25442 Effusion, left hand: Secondary | ICD-10-CM | POA: Diagnosis not present

## 2020-10-21 DIAGNOSIS — M1811 Unilateral primary osteoarthritis of first carpometacarpal joint, right hand: Secondary | ICD-10-CM | POA: Diagnosis not present

## 2020-10-21 DIAGNOSIS — M79642 Pain in left hand: Secondary | ICD-10-CM | POA: Diagnosis not present

## 2020-10-21 DIAGNOSIS — M25642 Stiffness of left hand, not elsewhere classified: Secondary | ICD-10-CM | POA: Diagnosis not present

## 2020-10-25 DIAGNOSIS — M79642 Pain in left hand: Secondary | ICD-10-CM | POA: Diagnosis not present

## 2020-10-25 DIAGNOSIS — M1811 Unilateral primary osteoarthritis of first carpometacarpal joint, right hand: Secondary | ICD-10-CM | POA: Diagnosis not present

## 2020-10-25 DIAGNOSIS — M25442 Effusion, left hand: Secondary | ICD-10-CM | POA: Diagnosis not present

## 2020-10-25 DIAGNOSIS — M25642 Stiffness of left hand, not elsewhere classified: Secondary | ICD-10-CM | POA: Diagnosis not present

## 2020-10-28 DIAGNOSIS — M25442 Effusion, left hand: Secondary | ICD-10-CM | POA: Diagnosis not present

## 2020-10-28 DIAGNOSIS — M79642 Pain in left hand: Secondary | ICD-10-CM | POA: Diagnosis not present

## 2020-10-28 DIAGNOSIS — M1811 Unilateral primary osteoarthritis of first carpometacarpal joint, right hand: Secondary | ICD-10-CM | POA: Diagnosis not present

## 2020-10-28 DIAGNOSIS — M25642 Stiffness of left hand, not elsewhere classified: Secondary | ICD-10-CM | POA: Diagnosis not present

## 2020-11-01 DIAGNOSIS — M25642 Stiffness of left hand, not elsewhere classified: Secondary | ICD-10-CM | POA: Diagnosis not present

## 2020-11-01 DIAGNOSIS — M1811 Unilateral primary osteoarthritis of first carpometacarpal joint, right hand: Secondary | ICD-10-CM | POA: Diagnosis not present

## 2020-11-01 DIAGNOSIS — M25442 Effusion, left hand: Secondary | ICD-10-CM | POA: Diagnosis not present

## 2020-11-01 DIAGNOSIS — M79642 Pain in left hand: Secondary | ICD-10-CM | POA: Diagnosis not present

## 2020-11-04 DIAGNOSIS — M1811 Unilateral primary osteoarthritis of first carpometacarpal joint, right hand: Secondary | ICD-10-CM | POA: Diagnosis not present

## 2020-11-04 DIAGNOSIS — M25642 Stiffness of left hand, not elsewhere classified: Secondary | ICD-10-CM | POA: Diagnosis not present

## 2020-11-04 DIAGNOSIS — M25442 Effusion, left hand: Secondary | ICD-10-CM | POA: Diagnosis not present

## 2020-11-04 DIAGNOSIS — M79642 Pain in left hand: Secondary | ICD-10-CM | POA: Diagnosis not present

## 2020-11-08 DIAGNOSIS — M25442 Effusion, left hand: Secondary | ICD-10-CM | POA: Diagnosis not present

## 2020-11-08 DIAGNOSIS — M79642 Pain in left hand: Secondary | ICD-10-CM | POA: Diagnosis not present

## 2020-11-08 DIAGNOSIS — M1811 Unilateral primary osteoarthritis of first carpometacarpal joint, right hand: Secondary | ICD-10-CM | POA: Diagnosis not present

## 2020-11-08 DIAGNOSIS — M25642 Stiffness of left hand, not elsewhere classified: Secondary | ICD-10-CM | POA: Diagnosis not present

## 2020-11-09 ENCOUNTER — Other Ambulatory Visit: Payer: Self-pay | Admitting: Obstetrics and Gynecology

## 2020-11-09 DIAGNOSIS — Z1231 Encounter for screening mammogram for malignant neoplasm of breast: Secondary | ICD-10-CM

## 2020-11-11 DIAGNOSIS — M79642 Pain in left hand: Secondary | ICD-10-CM | POA: Diagnosis not present

## 2020-11-11 DIAGNOSIS — M1811 Unilateral primary osteoarthritis of first carpometacarpal joint, right hand: Secondary | ICD-10-CM | POA: Diagnosis not present

## 2020-11-11 DIAGNOSIS — M25442 Effusion, left hand: Secondary | ICD-10-CM | POA: Diagnosis not present

## 2020-11-11 DIAGNOSIS — M25642 Stiffness of left hand, not elsewhere classified: Secondary | ICD-10-CM | POA: Diagnosis not present

## 2020-11-15 DIAGNOSIS — M25642 Stiffness of left hand, not elsewhere classified: Secondary | ICD-10-CM | POA: Diagnosis not present

## 2020-11-15 DIAGNOSIS — M1811 Unilateral primary osteoarthritis of first carpometacarpal joint, right hand: Secondary | ICD-10-CM | POA: Diagnosis not present

## 2020-11-15 DIAGNOSIS — M25442 Effusion, left hand: Secondary | ICD-10-CM | POA: Diagnosis not present

## 2020-11-15 DIAGNOSIS — M79642 Pain in left hand: Secondary | ICD-10-CM | POA: Diagnosis not present

## 2020-11-18 DIAGNOSIS — M79642 Pain in left hand: Secondary | ICD-10-CM | POA: Diagnosis not present

## 2020-11-18 DIAGNOSIS — M25642 Stiffness of left hand, not elsewhere classified: Secondary | ICD-10-CM | POA: Diagnosis not present

## 2020-11-18 DIAGNOSIS — M25442 Effusion, left hand: Secondary | ICD-10-CM | POA: Diagnosis not present

## 2020-11-18 DIAGNOSIS — M1811 Unilateral primary osteoarthritis of first carpometacarpal joint, right hand: Secondary | ICD-10-CM | POA: Diagnosis not present

## 2020-11-23 DIAGNOSIS — M79642 Pain in left hand: Secondary | ICD-10-CM | POA: Diagnosis not present

## 2020-11-23 DIAGNOSIS — M25642 Stiffness of left hand, not elsewhere classified: Secondary | ICD-10-CM | POA: Diagnosis not present

## 2020-11-23 DIAGNOSIS — M1811 Unilateral primary osteoarthritis of first carpometacarpal joint, right hand: Secondary | ICD-10-CM | POA: Diagnosis not present

## 2020-11-23 DIAGNOSIS — M25442 Effusion, left hand: Secondary | ICD-10-CM | POA: Diagnosis not present

## 2020-11-25 DIAGNOSIS — M1811 Unilateral primary osteoarthritis of first carpometacarpal joint, right hand: Secondary | ICD-10-CM | POA: Diagnosis not present

## 2020-11-25 DIAGNOSIS — M25642 Stiffness of left hand, not elsewhere classified: Secondary | ICD-10-CM | POA: Diagnosis not present

## 2020-11-25 DIAGNOSIS — M25442 Effusion, left hand: Secondary | ICD-10-CM | POA: Diagnosis not present

## 2020-11-25 DIAGNOSIS — M79642 Pain in left hand: Secondary | ICD-10-CM | POA: Diagnosis not present

## 2020-11-26 DIAGNOSIS — M9689 Other intraoperative and postprocedural complications and disorders of the musculoskeletal system: Secondary | ICD-10-CM | POA: Diagnosis not present

## 2020-11-26 DIAGNOSIS — M19042 Primary osteoarthritis, left hand: Secondary | ICD-10-CM | POA: Diagnosis not present

## 2020-11-29 DIAGNOSIS — M25642 Stiffness of left hand, not elsewhere classified: Secondary | ICD-10-CM | POA: Diagnosis not present

## 2020-11-29 DIAGNOSIS — M79642 Pain in left hand: Secondary | ICD-10-CM | POA: Diagnosis not present

## 2020-11-29 DIAGNOSIS — M1811 Unilateral primary osteoarthritis of first carpometacarpal joint, right hand: Secondary | ICD-10-CM | POA: Diagnosis not present

## 2020-11-29 DIAGNOSIS — J301 Allergic rhinitis due to pollen: Secondary | ICD-10-CM | POA: Diagnosis not present

## 2020-11-29 DIAGNOSIS — M25442 Effusion, left hand: Secondary | ICD-10-CM | POA: Diagnosis not present

## 2020-11-29 DIAGNOSIS — J3089 Other allergic rhinitis: Secondary | ICD-10-CM | POA: Diagnosis not present

## 2020-11-30 DIAGNOSIS — M5432 Sciatica, left side: Secondary | ICD-10-CM | POA: Diagnosis not present

## 2020-11-30 DIAGNOSIS — Z681 Body mass index (BMI) 19 or less, adult: Secondary | ICD-10-CM | POA: Diagnosis not present

## 2020-11-30 DIAGNOSIS — I10 Essential (primary) hypertension: Secondary | ICD-10-CM | POA: Diagnosis not present

## 2020-12-02 DIAGNOSIS — M79642 Pain in left hand: Secondary | ICD-10-CM | POA: Diagnosis not present

## 2020-12-02 DIAGNOSIS — M25642 Stiffness of left hand, not elsewhere classified: Secondary | ICD-10-CM | POA: Diagnosis not present

## 2020-12-02 DIAGNOSIS — M1811 Unilateral primary osteoarthritis of first carpometacarpal joint, right hand: Secondary | ICD-10-CM | POA: Diagnosis not present

## 2020-12-02 DIAGNOSIS — M25442 Effusion, left hand: Secondary | ICD-10-CM | POA: Diagnosis not present

## 2020-12-06 DIAGNOSIS — M25642 Stiffness of left hand, not elsewhere classified: Secondary | ICD-10-CM | POA: Diagnosis not present

## 2020-12-06 DIAGNOSIS — M1811 Unilateral primary osteoarthritis of first carpometacarpal joint, right hand: Secondary | ICD-10-CM | POA: Diagnosis not present

## 2020-12-06 DIAGNOSIS — M25442 Effusion, left hand: Secondary | ICD-10-CM | POA: Diagnosis not present

## 2020-12-06 DIAGNOSIS — M79642 Pain in left hand: Secondary | ICD-10-CM | POA: Diagnosis not present

## 2020-12-07 DIAGNOSIS — M5432 Sciatica, left side: Secondary | ICD-10-CM | POA: Diagnosis not present

## 2020-12-07 DIAGNOSIS — M1990 Unspecified osteoarthritis, unspecified site: Secondary | ICD-10-CM | POA: Diagnosis not present

## 2020-12-07 DIAGNOSIS — Z681 Body mass index (BMI) 19 or less, adult: Secondary | ICD-10-CM | POA: Diagnosis not present

## 2020-12-07 DIAGNOSIS — M419 Scoliosis, unspecified: Secondary | ICD-10-CM | POA: Diagnosis not present

## 2020-12-09 DIAGNOSIS — M79642 Pain in left hand: Secondary | ICD-10-CM | POA: Diagnosis not present

## 2020-12-09 DIAGNOSIS — M1811 Unilateral primary osteoarthritis of first carpometacarpal joint, right hand: Secondary | ICD-10-CM | POA: Diagnosis not present

## 2020-12-09 DIAGNOSIS — M25642 Stiffness of left hand, not elsewhere classified: Secondary | ICD-10-CM | POA: Diagnosis not present

## 2020-12-09 DIAGNOSIS — M25442 Effusion, left hand: Secondary | ICD-10-CM | POA: Diagnosis not present

## 2020-12-10 DIAGNOSIS — M419 Scoliosis, unspecified: Secondary | ICD-10-CM | POA: Diagnosis not present

## 2020-12-10 DIAGNOSIS — M5442 Lumbago with sciatica, left side: Secondary | ICD-10-CM | POA: Diagnosis not present

## 2020-12-10 DIAGNOSIS — M545 Low back pain, unspecified: Secondary | ICD-10-CM | POA: Diagnosis not present

## 2020-12-13 DIAGNOSIS — M25442 Effusion, left hand: Secondary | ICD-10-CM | POA: Diagnosis not present

## 2020-12-13 DIAGNOSIS — M545 Low back pain, unspecified: Secondary | ICD-10-CM | POA: Diagnosis not present

## 2020-12-13 DIAGNOSIS — M25642 Stiffness of left hand, not elsewhere classified: Secondary | ICD-10-CM | POA: Diagnosis not present

## 2020-12-13 DIAGNOSIS — M5442 Lumbago with sciatica, left side: Secondary | ICD-10-CM | POA: Diagnosis not present

## 2020-12-13 DIAGNOSIS — M79642 Pain in left hand: Secondary | ICD-10-CM | POA: Diagnosis not present

## 2020-12-13 DIAGNOSIS — M419 Scoliosis, unspecified: Secondary | ICD-10-CM | POA: Diagnosis not present

## 2020-12-13 DIAGNOSIS — M1811 Unilateral primary osteoarthritis of first carpometacarpal joint, right hand: Secondary | ICD-10-CM | POA: Diagnosis not present

## 2020-12-15 DIAGNOSIS — M545 Low back pain, unspecified: Secondary | ICD-10-CM | POA: Diagnosis not present

## 2020-12-15 DIAGNOSIS — M5442 Lumbago with sciatica, left side: Secondary | ICD-10-CM | POA: Diagnosis not present

## 2020-12-15 DIAGNOSIS — M419 Scoliosis, unspecified: Secondary | ICD-10-CM | POA: Diagnosis not present

## 2020-12-16 ENCOUNTER — Ambulatory Visit: Payer: Medicare Other

## 2020-12-16 DIAGNOSIS — M25642 Stiffness of left hand, not elsewhere classified: Secondary | ICD-10-CM | POA: Diagnosis not present

## 2020-12-16 DIAGNOSIS — M79642 Pain in left hand: Secondary | ICD-10-CM | POA: Diagnosis not present

## 2020-12-16 DIAGNOSIS — M1811 Unilateral primary osteoarthritis of first carpometacarpal joint, right hand: Secondary | ICD-10-CM | POA: Diagnosis not present

## 2020-12-16 DIAGNOSIS — M25442 Effusion, left hand: Secondary | ICD-10-CM | POA: Diagnosis not present

## 2020-12-20 DIAGNOSIS — M79642 Pain in left hand: Secondary | ICD-10-CM | POA: Diagnosis not present

## 2020-12-20 DIAGNOSIS — M25442 Effusion, left hand: Secondary | ICD-10-CM | POA: Diagnosis not present

## 2020-12-20 DIAGNOSIS — M25642 Stiffness of left hand, not elsewhere classified: Secondary | ICD-10-CM | POA: Diagnosis not present

## 2020-12-20 DIAGNOSIS — M1811 Unilateral primary osteoarthritis of first carpometacarpal joint, right hand: Secondary | ICD-10-CM | POA: Diagnosis not present

## 2020-12-21 DIAGNOSIS — M419 Scoliosis, unspecified: Secondary | ICD-10-CM | POA: Diagnosis not present

## 2020-12-21 DIAGNOSIS — M5442 Lumbago with sciatica, left side: Secondary | ICD-10-CM | POA: Diagnosis not present

## 2020-12-21 DIAGNOSIS — M545 Low back pain, unspecified: Secondary | ICD-10-CM | POA: Diagnosis not present

## 2020-12-23 DIAGNOSIS — M79642 Pain in left hand: Secondary | ICD-10-CM | POA: Diagnosis not present

## 2020-12-23 DIAGNOSIS — M25642 Stiffness of left hand, not elsewhere classified: Secondary | ICD-10-CM | POA: Diagnosis not present

## 2020-12-23 DIAGNOSIS — M25442 Effusion, left hand: Secondary | ICD-10-CM | POA: Diagnosis not present

## 2020-12-23 DIAGNOSIS — M1811 Unilateral primary osteoarthritis of first carpometacarpal joint, right hand: Secondary | ICD-10-CM | POA: Diagnosis not present

## 2020-12-24 DIAGNOSIS — M419 Scoliosis, unspecified: Secondary | ICD-10-CM | POA: Diagnosis not present

## 2020-12-24 DIAGNOSIS — M5442 Lumbago with sciatica, left side: Secondary | ICD-10-CM | POA: Diagnosis not present

## 2020-12-24 DIAGNOSIS — M545 Low back pain, unspecified: Secondary | ICD-10-CM | POA: Diagnosis not present

## 2020-12-25 ENCOUNTER — Other Ambulatory Visit: Payer: Self-pay

## 2020-12-25 ENCOUNTER — Ambulatory Visit
Admission: RE | Admit: 2020-12-25 | Discharge: 2020-12-25 | Disposition: A | Discharge status: Home / Self Care | Payer: Medicare Other | Source: Ambulatory Visit | Attending: Obstetrics and Gynecology | Admitting: Obstetrics and Gynecology

## 2020-12-25 DIAGNOSIS — Z1231 Encounter for screening mammogram for malignant neoplasm of breast: Secondary | ICD-10-CM

## 2020-12-27 DIAGNOSIS — M1811 Unilateral primary osteoarthritis of first carpometacarpal joint, right hand: Secondary | ICD-10-CM | POA: Diagnosis not present

## 2020-12-27 DIAGNOSIS — M25642 Stiffness of left hand, not elsewhere classified: Secondary | ICD-10-CM | POA: Diagnosis not present

## 2020-12-27 DIAGNOSIS — M79642 Pain in left hand: Secondary | ICD-10-CM | POA: Diagnosis not present

## 2020-12-27 DIAGNOSIS — M25442 Effusion, left hand: Secondary | ICD-10-CM | POA: Diagnosis not present

## 2020-12-29 DIAGNOSIS — Z681 Body mass index (BMI) 19 or less, adult: Secondary | ICD-10-CM | POA: Diagnosis not present

## 2020-12-29 DIAGNOSIS — M5416 Radiculopathy, lumbar region: Secondary | ICD-10-CM | POA: Diagnosis not present

## 2020-12-29 DIAGNOSIS — M41115 Juvenile idiopathic scoliosis, thoracolumbar region: Secondary | ICD-10-CM | POA: Diagnosis not present

## 2020-12-29 DIAGNOSIS — I10 Essential (primary) hypertension: Secondary | ICD-10-CM | POA: Diagnosis not present

## 2020-12-29 DIAGNOSIS — M545 Low back pain, unspecified: Secondary | ICD-10-CM | POA: Diagnosis not present

## 2020-12-30 DIAGNOSIS — M25642 Stiffness of left hand, not elsewhere classified: Secondary | ICD-10-CM | POA: Diagnosis not present

## 2020-12-30 DIAGNOSIS — M79642 Pain in left hand: Secondary | ICD-10-CM | POA: Diagnosis not present

## 2020-12-30 DIAGNOSIS — M25442 Effusion, left hand: Secondary | ICD-10-CM | POA: Diagnosis not present

## 2020-12-30 DIAGNOSIS — M1811 Unilateral primary osteoarthritis of first carpometacarpal joint, right hand: Secondary | ICD-10-CM | POA: Diagnosis not present

## 2021-01-03 DIAGNOSIS — H25813 Combined forms of age-related cataract, bilateral: Secondary | ICD-10-CM | POA: Diagnosis not present

## 2021-01-04 DIAGNOSIS — M25642 Stiffness of left hand, not elsewhere classified: Secondary | ICD-10-CM | POA: Diagnosis not present

## 2021-01-04 DIAGNOSIS — M25442 Effusion, left hand: Secondary | ICD-10-CM | POA: Diagnosis not present

## 2021-01-04 DIAGNOSIS — M1811 Unilateral primary osteoarthritis of first carpometacarpal joint, right hand: Secondary | ICD-10-CM | POA: Diagnosis not present

## 2021-01-04 DIAGNOSIS — M79642 Pain in left hand: Secondary | ICD-10-CM | POA: Diagnosis not present

## 2021-01-06 DIAGNOSIS — M1811 Unilateral primary osteoarthritis of first carpometacarpal joint, right hand: Secondary | ICD-10-CM | POA: Diagnosis not present

## 2021-01-06 DIAGNOSIS — M4319 Spondylolisthesis, multiple sites in spine: Secondary | ICD-10-CM | POA: Diagnosis not present

## 2021-01-06 DIAGNOSIS — M5416 Radiculopathy, lumbar region: Secondary | ICD-10-CM | POA: Diagnosis not present

## 2021-01-06 DIAGNOSIS — M5116 Intervertebral disc disorders with radiculopathy, lumbar region: Secondary | ICD-10-CM | POA: Diagnosis not present

## 2021-01-06 DIAGNOSIS — M25642 Stiffness of left hand, not elsewhere classified: Secondary | ICD-10-CM | POA: Diagnosis not present

## 2021-01-06 DIAGNOSIS — M5117 Intervertebral disc disorders with radiculopathy, lumbosacral region: Secondary | ICD-10-CM | POA: Diagnosis not present

## 2021-01-06 DIAGNOSIS — M79642 Pain in left hand: Secondary | ICD-10-CM | POA: Diagnosis not present

## 2021-01-06 DIAGNOSIS — M48061 Spinal stenosis, lumbar region without neurogenic claudication: Secondary | ICD-10-CM | POA: Diagnosis not present

## 2021-01-06 DIAGNOSIS — M25442 Effusion, left hand: Secondary | ICD-10-CM | POA: Diagnosis not present

## 2021-01-07 DIAGNOSIS — M19042 Primary osteoarthritis, left hand: Secondary | ICD-10-CM | POA: Diagnosis not present

## 2021-01-18 DIAGNOSIS — I7300 Raynaud's syndrome without gangrene: Secondary | ICD-10-CM | POA: Diagnosis not present

## 2021-01-18 DIAGNOSIS — M150 Primary generalized (osteo)arthritis: Secondary | ICD-10-CM | POA: Diagnosis not present

## 2021-01-18 DIAGNOSIS — M79641 Pain in right hand: Secondary | ICD-10-CM | POA: Diagnosis not present

## 2021-01-18 DIAGNOSIS — M2550 Pain in unspecified joint: Secondary | ICD-10-CM | POA: Diagnosis not present

## 2021-01-18 DIAGNOSIS — Z681 Body mass index (BMI) 19 or less, adult: Secondary | ICD-10-CM | POA: Diagnosis not present

## 2021-01-18 DIAGNOSIS — M79642 Pain in left hand: Secondary | ICD-10-CM | POA: Diagnosis not present

## 2021-01-26 DIAGNOSIS — M41115 Juvenile idiopathic scoliosis, thoracolumbar region: Secondary | ICD-10-CM | POA: Diagnosis not present

## 2021-01-26 DIAGNOSIS — M5416 Radiculopathy, lumbar region: Secondary | ICD-10-CM | POA: Diagnosis not present

## 2021-01-31 ENCOUNTER — Ambulatory Visit: Payer: Medicare Other

## 2021-02-23 DIAGNOSIS — M41115 Juvenile idiopathic scoliosis, thoracolumbar region: Secondary | ICD-10-CM | POA: Diagnosis not present

## 2021-02-23 DIAGNOSIS — I10 Essential (primary) hypertension: Secondary | ICD-10-CM | POA: Diagnosis not present

## 2021-02-23 DIAGNOSIS — M5416 Radiculopathy, lumbar region: Secondary | ICD-10-CM | POA: Diagnosis not present

## 2021-03-21 DIAGNOSIS — M5416 Radiculopathy, lumbar region: Secondary | ICD-10-CM | POA: Diagnosis not present

## 2021-03-30 DIAGNOSIS — M41115 Juvenile idiopathic scoliosis, thoracolumbar region: Secondary | ICD-10-CM | POA: Diagnosis not present

## 2021-03-30 DIAGNOSIS — M5416 Radiculopathy, lumbar region: Secondary | ICD-10-CM | POA: Diagnosis not present

## 2021-04-14 DIAGNOSIS — D2239 Melanocytic nevi of other parts of face: Secondary | ICD-10-CM | POA: Diagnosis not present

## 2021-04-14 DIAGNOSIS — D1801 Hemangioma of skin and subcutaneous tissue: Secondary | ICD-10-CM | POA: Diagnosis not present

## 2021-04-14 DIAGNOSIS — D225 Melanocytic nevi of trunk: Secondary | ICD-10-CM | POA: Diagnosis not present

## 2021-04-14 DIAGNOSIS — L814 Other melanin hyperpigmentation: Secondary | ICD-10-CM | POA: Diagnosis not present

## 2021-05-03 DIAGNOSIS — M41115 Juvenile idiopathic scoliosis, thoracolumbar region: Secondary | ICD-10-CM | POA: Diagnosis not present

## 2021-05-03 DIAGNOSIS — M5416 Radiculopathy, lumbar region: Secondary | ICD-10-CM | POA: Diagnosis not present

## 2021-05-17 DIAGNOSIS — M5451 Vertebrogenic low back pain: Secondary | ICD-10-CM | POA: Diagnosis not present

## 2021-05-17 DIAGNOSIS — M4126 Other idiopathic scoliosis, lumbar region: Secondary | ICD-10-CM | POA: Diagnosis not present

## 2021-05-17 DIAGNOSIS — M5136 Other intervertebral disc degeneration, lumbar region: Secondary | ICD-10-CM | POA: Diagnosis not present

## 2021-05-17 DIAGNOSIS — M9905 Segmental and somatic dysfunction of pelvic region: Secondary | ICD-10-CM | POA: Diagnosis not present

## 2021-05-17 DIAGNOSIS — M461 Sacroiliitis, not elsewhere classified: Secondary | ICD-10-CM | POA: Diagnosis not present

## 2021-05-17 DIAGNOSIS — M9903 Segmental and somatic dysfunction of lumbar region: Secondary | ICD-10-CM | POA: Diagnosis not present

## 2021-05-17 DIAGNOSIS — M9902 Segmental and somatic dysfunction of thoracic region: Secondary | ICD-10-CM | POA: Diagnosis not present

## 2021-05-20 DIAGNOSIS — M5451 Vertebrogenic low back pain: Secondary | ICD-10-CM | POA: Diagnosis not present

## 2021-05-20 DIAGNOSIS — M9903 Segmental and somatic dysfunction of lumbar region: Secondary | ICD-10-CM | POA: Diagnosis not present

## 2021-05-20 DIAGNOSIS — M9905 Segmental and somatic dysfunction of pelvic region: Secondary | ICD-10-CM | POA: Diagnosis not present

## 2021-05-20 DIAGNOSIS — M9902 Segmental and somatic dysfunction of thoracic region: Secondary | ICD-10-CM | POA: Diagnosis not present

## 2021-05-20 DIAGNOSIS — M5136 Other intervertebral disc degeneration, lumbar region: Secondary | ICD-10-CM | POA: Diagnosis not present

## 2021-05-20 DIAGNOSIS — M4126 Other idiopathic scoliosis, lumbar region: Secondary | ICD-10-CM | POA: Diagnosis not present

## 2021-05-20 DIAGNOSIS — M461 Sacroiliitis, not elsewhere classified: Secondary | ICD-10-CM | POA: Diagnosis not present

## 2021-05-24 DIAGNOSIS — M5136 Other intervertebral disc degeneration, lumbar region: Secondary | ICD-10-CM | POA: Diagnosis not present

## 2021-05-24 DIAGNOSIS — M461 Sacroiliitis, not elsewhere classified: Secondary | ICD-10-CM | POA: Diagnosis not present

## 2021-05-24 DIAGNOSIS — M9905 Segmental and somatic dysfunction of pelvic region: Secondary | ICD-10-CM | POA: Diagnosis not present

## 2021-05-24 DIAGNOSIS — M4126 Other idiopathic scoliosis, lumbar region: Secondary | ICD-10-CM | POA: Diagnosis not present

## 2021-05-24 DIAGNOSIS — M9903 Segmental and somatic dysfunction of lumbar region: Secondary | ICD-10-CM | POA: Diagnosis not present

## 2021-05-24 DIAGNOSIS — M5451 Vertebrogenic low back pain: Secondary | ICD-10-CM | POA: Diagnosis not present

## 2021-05-24 DIAGNOSIS — M9902 Segmental and somatic dysfunction of thoracic region: Secondary | ICD-10-CM | POA: Diagnosis not present

## 2021-05-25 DIAGNOSIS — M9902 Segmental and somatic dysfunction of thoracic region: Secondary | ICD-10-CM | POA: Diagnosis not present

## 2021-05-25 DIAGNOSIS — M461 Sacroiliitis, not elsewhere classified: Secondary | ICD-10-CM | POA: Diagnosis not present

## 2021-05-25 DIAGNOSIS — M5136 Other intervertebral disc degeneration, lumbar region: Secondary | ICD-10-CM | POA: Diagnosis not present

## 2021-05-25 DIAGNOSIS — M9903 Segmental and somatic dysfunction of lumbar region: Secondary | ICD-10-CM | POA: Diagnosis not present

## 2021-05-25 DIAGNOSIS — M4126 Other idiopathic scoliosis, lumbar region: Secondary | ICD-10-CM | POA: Diagnosis not present

## 2021-05-25 DIAGNOSIS — M9905 Segmental and somatic dysfunction of pelvic region: Secondary | ICD-10-CM | POA: Diagnosis not present

## 2021-05-25 DIAGNOSIS — M5451 Vertebrogenic low back pain: Secondary | ICD-10-CM | POA: Diagnosis not present

## 2021-05-27 DIAGNOSIS — M4126 Other idiopathic scoliosis, lumbar region: Secondary | ICD-10-CM | POA: Diagnosis not present

## 2021-05-27 DIAGNOSIS — M9903 Segmental and somatic dysfunction of lumbar region: Secondary | ICD-10-CM | POA: Diagnosis not present

## 2021-05-27 DIAGNOSIS — M9905 Segmental and somatic dysfunction of pelvic region: Secondary | ICD-10-CM | POA: Diagnosis not present

## 2021-05-27 DIAGNOSIS — M9902 Segmental and somatic dysfunction of thoracic region: Secondary | ICD-10-CM | POA: Diagnosis not present

## 2021-05-27 DIAGNOSIS — M5136 Other intervertebral disc degeneration, lumbar region: Secondary | ICD-10-CM | POA: Diagnosis not present

## 2021-05-27 DIAGNOSIS — M461 Sacroiliitis, not elsewhere classified: Secondary | ICD-10-CM | POA: Diagnosis not present

## 2021-05-27 DIAGNOSIS — M5451 Vertebrogenic low back pain: Secondary | ICD-10-CM | POA: Diagnosis not present

## 2021-05-31 DIAGNOSIS — M9902 Segmental and somatic dysfunction of thoracic region: Secondary | ICD-10-CM | POA: Diagnosis not present

## 2021-05-31 DIAGNOSIS — M461 Sacroiliitis, not elsewhere classified: Secondary | ICD-10-CM | POA: Diagnosis not present

## 2021-05-31 DIAGNOSIS — M9905 Segmental and somatic dysfunction of pelvic region: Secondary | ICD-10-CM | POA: Diagnosis not present

## 2021-05-31 DIAGNOSIS — M4126 Other idiopathic scoliosis, lumbar region: Secondary | ICD-10-CM | POA: Diagnosis not present

## 2021-05-31 DIAGNOSIS — M5136 Other intervertebral disc degeneration, lumbar region: Secondary | ICD-10-CM | POA: Diagnosis not present

## 2021-05-31 DIAGNOSIS — M5451 Vertebrogenic low back pain: Secondary | ICD-10-CM | POA: Diagnosis not present

## 2021-05-31 DIAGNOSIS — M9903 Segmental and somatic dysfunction of lumbar region: Secondary | ICD-10-CM | POA: Diagnosis not present

## 2021-06-01 DIAGNOSIS — M461 Sacroiliitis, not elsewhere classified: Secondary | ICD-10-CM | POA: Diagnosis not present

## 2021-06-01 DIAGNOSIS — M4126 Other idiopathic scoliosis, lumbar region: Secondary | ICD-10-CM | POA: Diagnosis not present

## 2021-06-01 DIAGNOSIS — M5451 Vertebrogenic low back pain: Secondary | ICD-10-CM | POA: Diagnosis not present

## 2021-06-01 DIAGNOSIS — M5136 Other intervertebral disc degeneration, lumbar region: Secondary | ICD-10-CM | POA: Diagnosis not present

## 2021-06-01 DIAGNOSIS — M9905 Segmental and somatic dysfunction of pelvic region: Secondary | ICD-10-CM | POA: Diagnosis not present

## 2021-06-01 DIAGNOSIS — M9902 Segmental and somatic dysfunction of thoracic region: Secondary | ICD-10-CM | POA: Diagnosis not present

## 2021-06-01 DIAGNOSIS — M9903 Segmental and somatic dysfunction of lumbar region: Secondary | ICD-10-CM | POA: Diagnosis not present

## 2021-06-03 DIAGNOSIS — M461 Sacroiliitis, not elsewhere classified: Secondary | ICD-10-CM | POA: Diagnosis not present

## 2021-06-03 DIAGNOSIS — M9903 Segmental and somatic dysfunction of lumbar region: Secondary | ICD-10-CM | POA: Diagnosis not present

## 2021-06-03 DIAGNOSIS — M5136 Other intervertebral disc degeneration, lumbar region: Secondary | ICD-10-CM | POA: Diagnosis not present

## 2021-06-03 DIAGNOSIS — M9905 Segmental and somatic dysfunction of pelvic region: Secondary | ICD-10-CM | POA: Diagnosis not present

## 2021-06-03 DIAGNOSIS — M9902 Segmental and somatic dysfunction of thoracic region: Secondary | ICD-10-CM | POA: Diagnosis not present

## 2021-06-03 DIAGNOSIS — M5451 Vertebrogenic low back pain: Secondary | ICD-10-CM | POA: Diagnosis not present

## 2021-06-03 DIAGNOSIS — M4126 Other idiopathic scoliosis, lumbar region: Secondary | ICD-10-CM | POA: Diagnosis not present

## 2021-06-07 DIAGNOSIS — M4126 Other idiopathic scoliosis, lumbar region: Secondary | ICD-10-CM | POA: Diagnosis not present

## 2021-06-07 DIAGNOSIS — M9902 Segmental and somatic dysfunction of thoracic region: Secondary | ICD-10-CM | POA: Diagnosis not present

## 2021-06-07 DIAGNOSIS — M461 Sacroiliitis, not elsewhere classified: Secondary | ICD-10-CM | POA: Diagnosis not present

## 2021-06-07 DIAGNOSIS — M5451 Vertebrogenic low back pain: Secondary | ICD-10-CM | POA: Diagnosis not present

## 2021-06-07 DIAGNOSIS — M9905 Segmental and somatic dysfunction of pelvic region: Secondary | ICD-10-CM | POA: Diagnosis not present

## 2021-06-07 DIAGNOSIS — M9903 Segmental and somatic dysfunction of lumbar region: Secondary | ICD-10-CM | POA: Diagnosis not present

## 2021-06-07 DIAGNOSIS — M5136 Other intervertebral disc degeneration, lumbar region: Secondary | ICD-10-CM | POA: Diagnosis not present

## 2021-06-08 DIAGNOSIS — M5136 Other intervertebral disc degeneration, lumbar region: Secondary | ICD-10-CM | POA: Diagnosis not present

## 2021-06-08 DIAGNOSIS — M461 Sacroiliitis, not elsewhere classified: Secondary | ICD-10-CM | POA: Diagnosis not present

## 2021-06-08 DIAGNOSIS — M4126 Other idiopathic scoliosis, lumbar region: Secondary | ICD-10-CM | POA: Diagnosis not present

## 2021-06-08 DIAGNOSIS — M9905 Segmental and somatic dysfunction of pelvic region: Secondary | ICD-10-CM | POA: Diagnosis not present

## 2021-06-08 DIAGNOSIS — M9903 Segmental and somatic dysfunction of lumbar region: Secondary | ICD-10-CM | POA: Diagnosis not present

## 2021-06-08 DIAGNOSIS — M5451 Vertebrogenic low back pain: Secondary | ICD-10-CM | POA: Diagnosis not present

## 2021-06-08 DIAGNOSIS — M9902 Segmental and somatic dysfunction of thoracic region: Secondary | ICD-10-CM | POA: Diagnosis not present

## 2021-06-09 DIAGNOSIS — J301 Allergic rhinitis due to pollen: Secondary | ICD-10-CM | POA: Diagnosis not present

## 2021-06-09 DIAGNOSIS — R21 Rash and other nonspecific skin eruption: Secondary | ICD-10-CM | POA: Diagnosis not present

## 2021-06-09 DIAGNOSIS — H1045 Other chronic allergic conjunctivitis: Secondary | ICD-10-CM | POA: Diagnosis not present

## 2021-06-09 DIAGNOSIS — J3089 Other allergic rhinitis: Secondary | ICD-10-CM | POA: Diagnosis not present

## 2021-06-17 IMAGING — MG MM DIGITAL SCREENING BILAT W/ TOMO AND CAD
8 series · 9 of 24 positions shown · non-contrast
Comparison: Previous exam(s).

CLINICAL DATA: Screening.

EXAM:
DIGITAL SCREENING BILATERAL MAMMOGRAM WITH TOMOSYNTHESIS AND CAD
TECHNIQUE: Bilateral screening digital craniocaudal and mediolateral oblique
mammograms were obtained. Bilateral screening digital breast
tomosynthesis was performed. The images were evaluated with
computer-aided detection.

[L CC synth-2D]
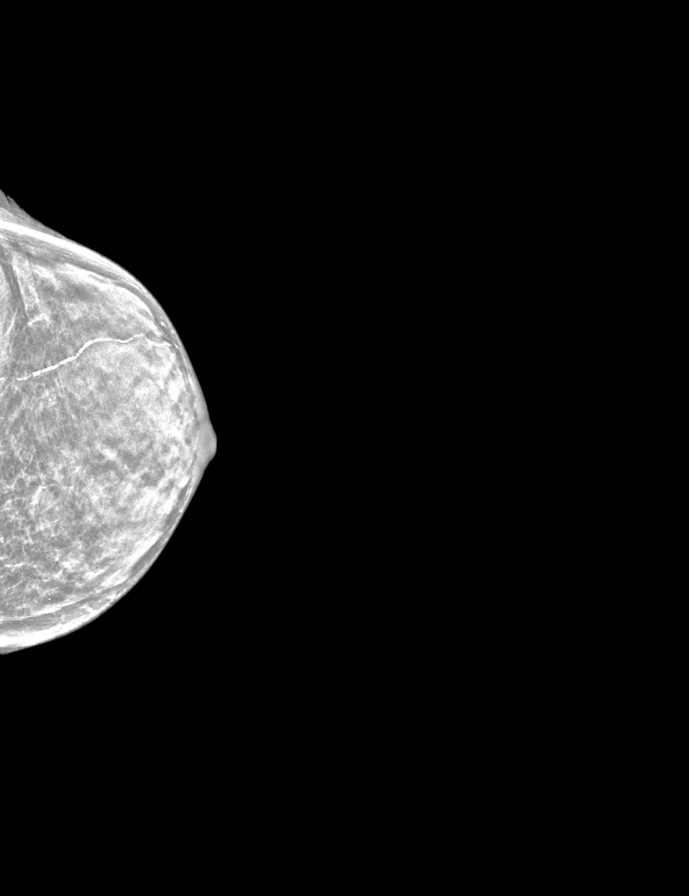

[R CC synth-2D]
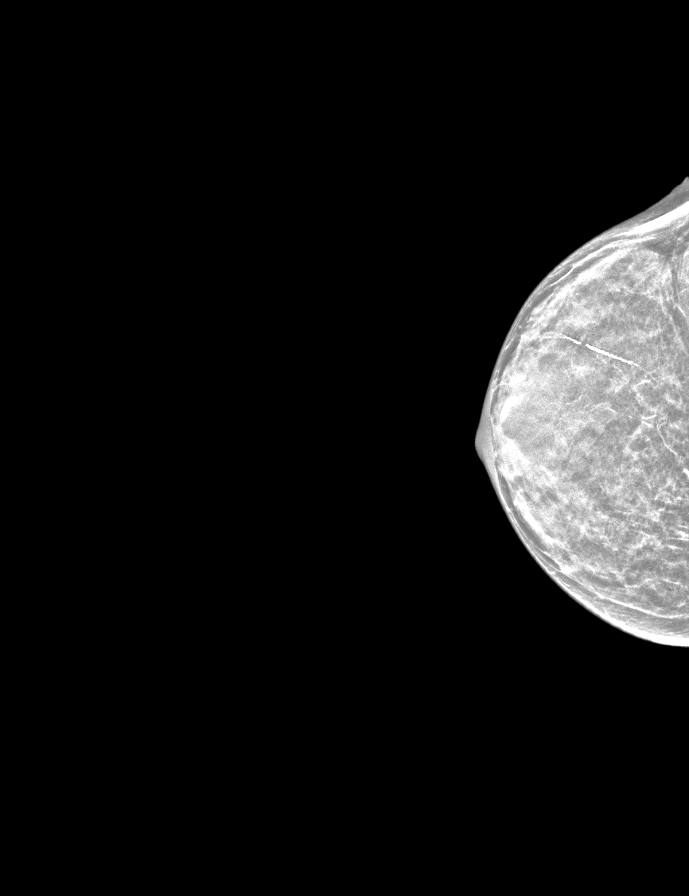

[L MLO synth-2D]
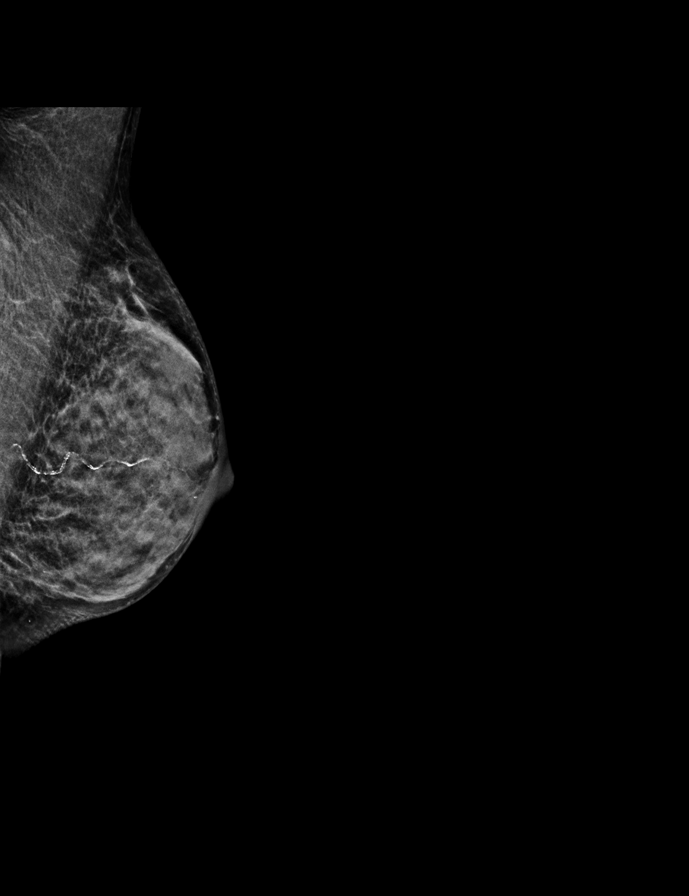

[R MLO synth-2D]
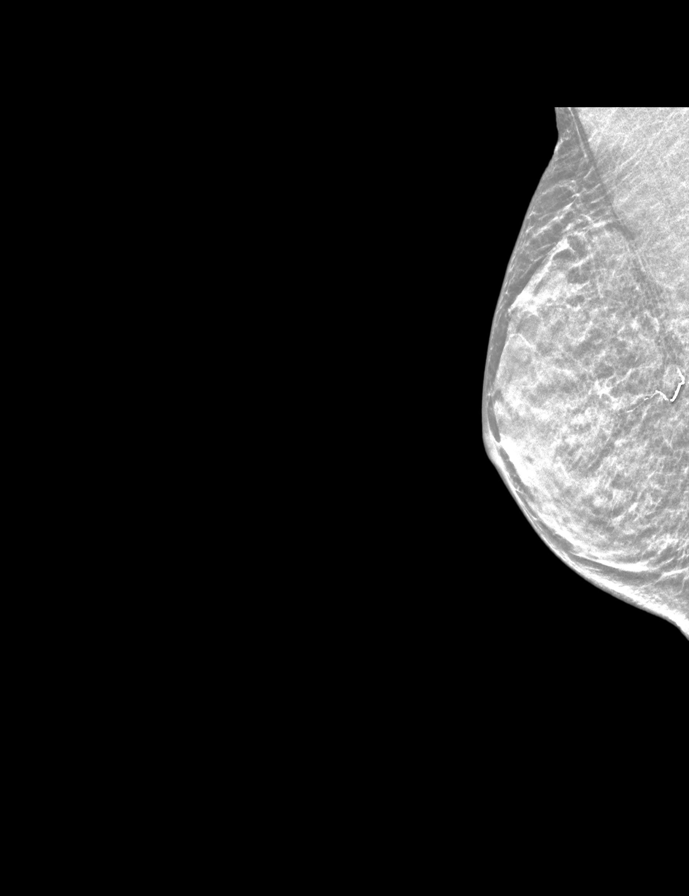

[L CC tomo · 2 of 31 frames shown]
[frame 11/31]
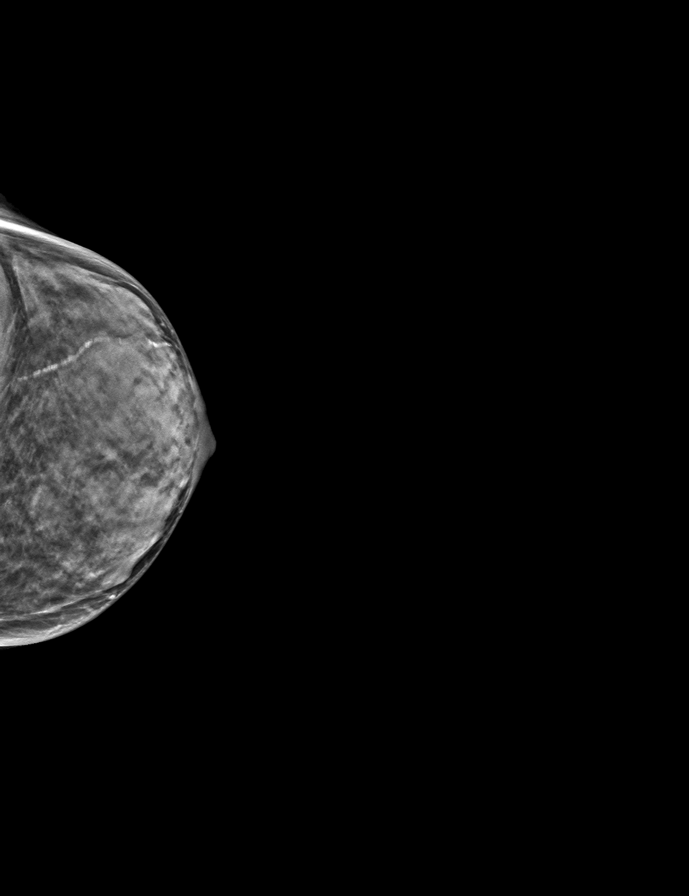
[frame 16/31]
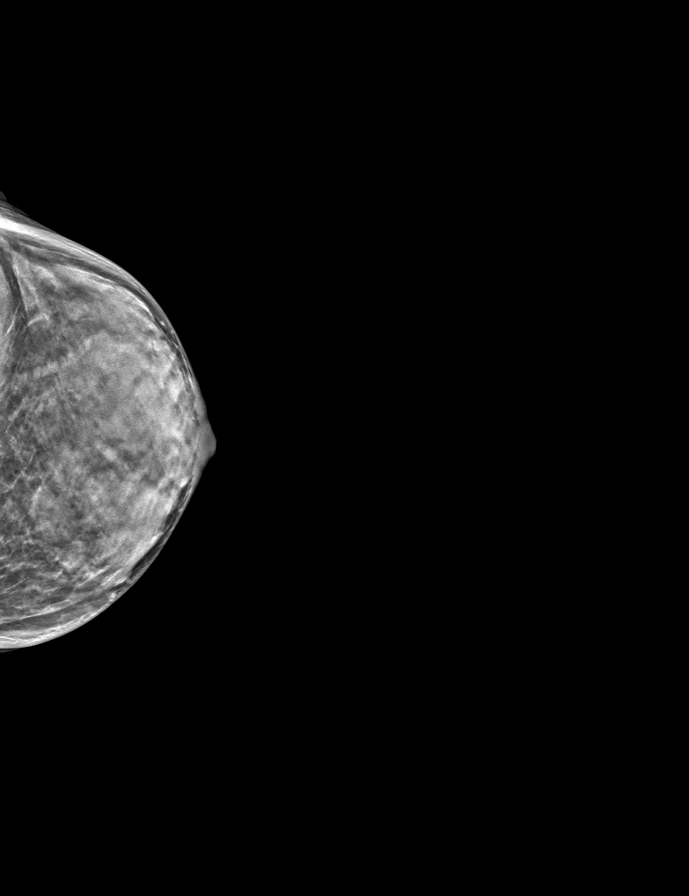

[L MLO tomo · tomo slice 17/33.0]
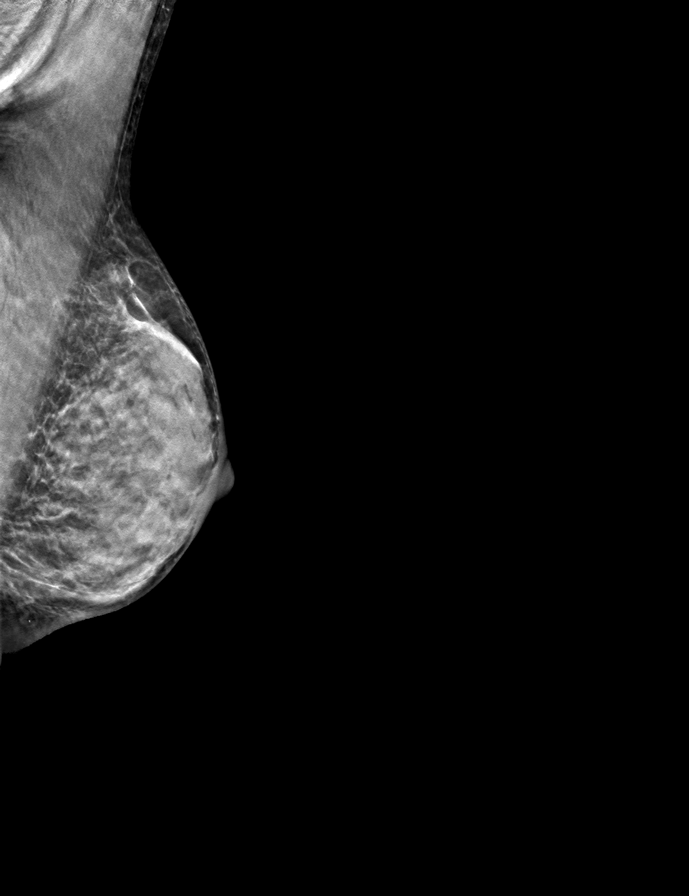

[R CC tomo · tomo slice 14/27.0]
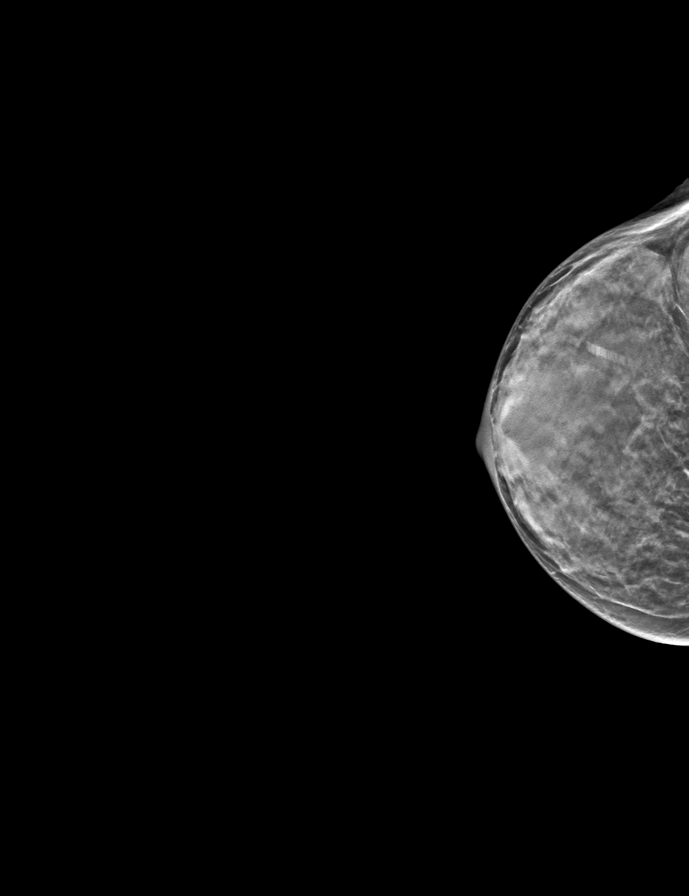

[R MLO tomo · tomo slice 14/27.0]
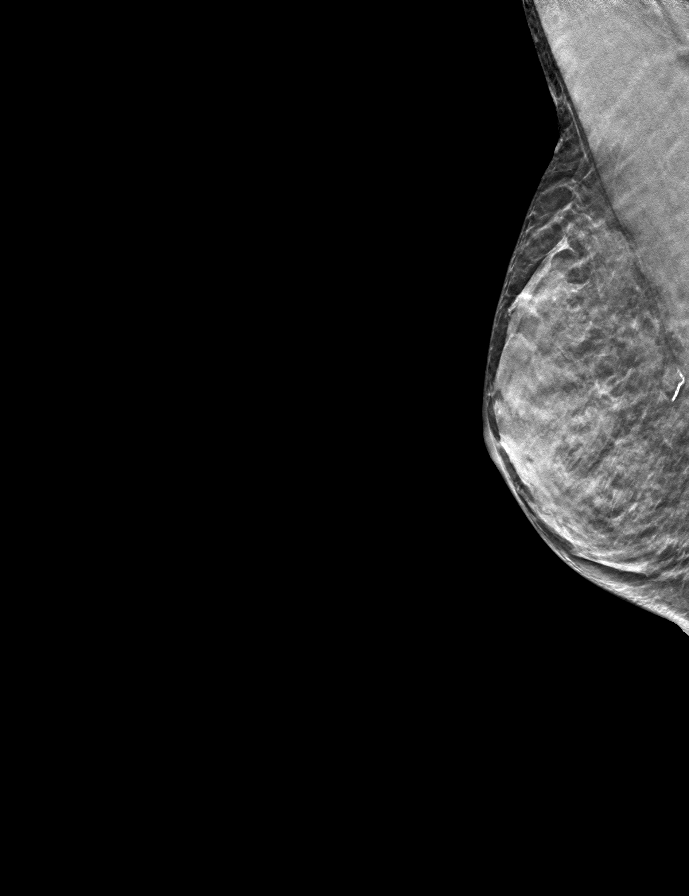

[9 of 24 positions shown; findings below may reference images not displayed]

ACR Breast Density Category d: The breast tissue is extremely dense,
which lowers the sensitivity of mammography
FINDINGS: There are no findings suspicious for malignancy.
IMPRESSION: No mammographic evidence of malignancy. A result letter of this
screening mammogram will be mailed directly to the patient.

RECOMMENDATION:
Screening mammogram in one year. (Code:TA-V-WV9)

BI-RADS CATEGORY  1: Negative.

## 2021-06-23 DIAGNOSIS — M4316 Spondylolisthesis, lumbar region: Secondary | ICD-10-CM | POA: Diagnosis not present

## 2021-06-23 DIAGNOSIS — M4150 Other secondary scoliosis, site unspecified: Secondary | ICD-10-CM | POA: Diagnosis not present

## 2021-06-23 DIAGNOSIS — M4716 Other spondylosis with myelopathy, lumbar region: Secondary | ICD-10-CM | POA: Diagnosis not present

## 2021-06-23 DIAGNOSIS — M48062 Spinal stenosis, lumbar region with neurogenic claudication: Secondary | ICD-10-CM | POA: Diagnosis not present

## 2021-06-24 DIAGNOSIS — Z79899 Other long term (current) drug therapy: Secondary | ICD-10-CM | POA: Diagnosis not present

## 2021-06-24 DIAGNOSIS — M1990 Unspecified osteoarthritis, unspecified site: Secondary | ICD-10-CM | POA: Diagnosis not present

## 2021-06-24 DIAGNOSIS — E871 Hypo-osmolality and hyponatremia: Secondary | ICD-10-CM | POA: Diagnosis not present

## 2021-06-24 DIAGNOSIS — Z681 Body mass index (BMI) 19 or less, adult: Secondary | ICD-10-CM | POA: Diagnosis not present

## 2021-06-24 DIAGNOSIS — I10 Essential (primary) hypertension: Secondary | ICD-10-CM | POA: Diagnosis not present

## 2021-07-08 DIAGNOSIS — Z4689 Encounter for fitting and adjustment of other specified devices: Secondary | ICD-10-CM | POA: Diagnosis not present

## 2021-07-08 DIAGNOSIS — M48062 Spinal stenosis, lumbar region with neurogenic claudication: Secondary | ICD-10-CM | POA: Diagnosis not present

## 2021-07-08 DIAGNOSIS — M4716 Other spondylosis with myelopathy, lumbar region: Secondary | ICD-10-CM | POA: Diagnosis not present

## 2021-07-08 DIAGNOSIS — M4150 Other secondary scoliosis, site unspecified: Secondary | ICD-10-CM | POA: Diagnosis not present

## 2021-07-08 DIAGNOSIS — M4316 Spondylolisthesis, lumbar region: Secondary | ICD-10-CM | POA: Diagnosis not present

## 2021-07-18 DIAGNOSIS — Z01818 Encounter for other preprocedural examination: Secondary | ICD-10-CM | POA: Diagnosis not present

## 2021-07-18 DIAGNOSIS — I498 Other specified cardiac arrhythmias: Secondary | ICD-10-CM | POA: Diagnosis not present

## 2021-07-18 DIAGNOSIS — M48061 Spinal stenosis, lumbar region without neurogenic claudication: Secondary | ICD-10-CM | POA: Diagnosis not present

## 2021-07-18 DIAGNOSIS — R9431 Abnormal electrocardiogram [ECG] [EKG]: Secondary | ICD-10-CM | POA: Diagnosis not present

## 2021-07-25 DIAGNOSIS — S32050A Wedge compression fracture of fifth lumbar vertebra, initial encounter for closed fracture: Secondary | ICD-10-CM | POA: Diagnosis not present

## 2021-07-25 DIAGNOSIS — M4326 Fusion of spine, lumbar region: Secondary | ICD-10-CM | POA: Diagnosis not present

## 2021-07-25 DIAGNOSIS — M4150 Other secondary scoliosis, site unspecified: Secondary | ICD-10-CM | POA: Diagnosis not present

## 2021-07-25 DIAGNOSIS — M4716 Other spondylosis with myelopathy, lumbar region: Secondary | ICD-10-CM | POA: Diagnosis not present

## 2021-07-25 DIAGNOSIS — M4125 Other idiopathic scoliosis, thoracolumbar region: Secondary | ICD-10-CM | POA: Diagnosis not present

## 2021-07-25 DIAGNOSIS — M4316 Spondylolisthesis, lumbar region: Secondary | ICD-10-CM | POA: Diagnosis not present

## 2021-07-25 DIAGNOSIS — M48062 Spinal stenosis, lumbar region with neurogenic claudication: Secondary | ICD-10-CM | POA: Diagnosis not present

## 2021-07-25 DIAGNOSIS — Z981 Arthrodesis status: Secondary | ICD-10-CM | POA: Diagnosis not present

## 2021-07-25 DIAGNOSIS — M47816 Spondylosis without myelopathy or radiculopathy, lumbar region: Secondary | ICD-10-CM | POA: Diagnosis not present

## 2021-07-25 DIAGNOSIS — M5116 Intervertebral disc disorders with radiculopathy, lumbar region: Secondary | ICD-10-CM | POA: Diagnosis not present

## 2021-07-26 DIAGNOSIS — M4316 Spondylolisthesis, lumbar region: Secondary | ICD-10-CM | POA: Diagnosis not present

## 2021-07-26 DIAGNOSIS — Z981 Arthrodesis status: Secondary | ICD-10-CM | POA: Diagnosis not present

## 2021-07-26 DIAGNOSIS — M4326 Fusion of spine, lumbar region: Secondary | ICD-10-CM | POA: Diagnosis not present

## 2021-07-26 DIAGNOSIS — M4186 Other forms of scoliosis, lumbar region: Secondary | ICD-10-CM | POA: Diagnosis not present

## 2021-07-26 DIAGNOSIS — M5116 Intervertebral disc disorders with radiculopathy, lumbar region: Secondary | ICD-10-CM | POA: Diagnosis not present

## 2021-07-26 DIAGNOSIS — M48062 Spinal stenosis, lumbar region with neurogenic claudication: Secondary | ICD-10-CM | POA: Diagnosis not present

## 2021-07-26 DIAGNOSIS — M4125 Other idiopathic scoliosis, thoracolumbar region: Secondary | ICD-10-CM | POA: Diagnosis not present

## 2021-07-27 DIAGNOSIS — M4125 Other idiopathic scoliosis, thoracolumbar region: Secondary | ICD-10-CM | POA: Diagnosis not present

## 2021-07-27 DIAGNOSIS — M5116 Intervertebral disc disorders with radiculopathy, lumbar region: Secondary | ICD-10-CM | POA: Diagnosis not present

## 2021-07-27 DIAGNOSIS — M48062 Spinal stenosis, lumbar region with neurogenic claudication: Secondary | ICD-10-CM | POA: Diagnosis not present

## 2021-07-27 DIAGNOSIS — M4316 Spondylolisthesis, lumbar region: Secondary | ICD-10-CM | POA: Diagnosis not present

## 2021-07-29 DIAGNOSIS — M545 Low back pain, unspecified: Secondary | ICD-10-CM | POA: Diagnosis not present

## 2021-07-29 DIAGNOSIS — Z981 Arthrodesis status: Secondary | ICD-10-CM | POA: Diagnosis not present

## 2021-07-29 DIAGNOSIS — M4316 Spondylolisthesis, lumbar region: Secondary | ICD-10-CM | POA: Diagnosis not present

## 2021-07-29 DIAGNOSIS — M8580 Other specified disorders of bone density and structure, unspecified site: Secondary | ICD-10-CM | POA: Diagnosis not present

## 2021-07-29 DIAGNOSIS — Z4789 Encounter for other orthopedic aftercare: Secondary | ICD-10-CM | POA: Diagnosis not present

## 2021-07-29 DIAGNOSIS — Z9181 History of falling: Secondary | ICD-10-CM | POA: Diagnosis not present

## 2021-07-29 DIAGNOSIS — M5116 Intervertebral disc disorders with radiculopathy, lumbar region: Secondary | ICD-10-CM | POA: Diagnosis not present

## 2021-07-29 DIAGNOSIS — I10 Essential (primary) hypertension: Secondary | ICD-10-CM | POA: Diagnosis not present

## 2021-07-29 DIAGNOSIS — M4716 Other spondylosis with myelopathy, lumbar region: Secondary | ICD-10-CM | POA: Diagnosis not present

## 2021-07-29 DIAGNOSIS — M4125 Other idiopathic scoliosis, thoracolumbar region: Secondary | ICD-10-CM | POA: Diagnosis not present

## 2021-08-01 DIAGNOSIS — M4716 Other spondylosis with myelopathy, lumbar region: Secondary | ICD-10-CM | POA: Diagnosis not present

## 2021-08-01 DIAGNOSIS — Z4789 Encounter for other orthopedic aftercare: Secondary | ICD-10-CM | POA: Diagnosis not present

## 2021-08-01 DIAGNOSIS — M5116 Intervertebral disc disorders with radiculopathy, lumbar region: Secondary | ICD-10-CM | POA: Diagnosis not present

## 2021-08-01 DIAGNOSIS — M4316 Spondylolisthesis, lumbar region: Secondary | ICD-10-CM | POA: Diagnosis not present

## 2021-08-01 DIAGNOSIS — M4125 Other idiopathic scoliosis, thoracolumbar region: Secondary | ICD-10-CM | POA: Diagnosis not present

## 2021-08-01 DIAGNOSIS — M545 Low back pain, unspecified: Secondary | ICD-10-CM | POA: Diagnosis not present

## 2021-08-02 DIAGNOSIS — M545 Low back pain, unspecified: Secondary | ICD-10-CM | POA: Diagnosis not present

## 2021-08-02 DIAGNOSIS — M5116 Intervertebral disc disorders with radiculopathy, lumbar region: Secondary | ICD-10-CM | POA: Diagnosis not present

## 2021-08-02 DIAGNOSIS — M4125 Other idiopathic scoliosis, thoracolumbar region: Secondary | ICD-10-CM | POA: Diagnosis not present

## 2021-08-02 DIAGNOSIS — M4316 Spondylolisthesis, lumbar region: Secondary | ICD-10-CM | POA: Diagnosis not present

## 2021-08-02 DIAGNOSIS — Z4789 Encounter for other orthopedic aftercare: Secondary | ICD-10-CM | POA: Diagnosis not present

## 2021-08-02 DIAGNOSIS — M4716 Other spondylosis with myelopathy, lumbar region: Secondary | ICD-10-CM | POA: Diagnosis not present

## 2021-08-05 DIAGNOSIS — M4316 Spondylolisthesis, lumbar region: Secondary | ICD-10-CM | POA: Diagnosis not present

## 2021-08-05 DIAGNOSIS — M545 Low back pain, unspecified: Secondary | ICD-10-CM | POA: Diagnosis not present

## 2021-08-05 DIAGNOSIS — M4125 Other idiopathic scoliosis, thoracolumbar region: Secondary | ICD-10-CM | POA: Diagnosis not present

## 2021-08-05 DIAGNOSIS — M4716 Other spondylosis with myelopathy, lumbar region: Secondary | ICD-10-CM | POA: Diagnosis not present

## 2021-08-05 DIAGNOSIS — M5116 Intervertebral disc disorders with radiculopathy, lumbar region: Secondary | ICD-10-CM | POA: Diagnosis not present

## 2021-08-05 DIAGNOSIS — Z4789 Encounter for other orthopedic aftercare: Secondary | ICD-10-CM | POA: Diagnosis not present

## 2021-08-08 DIAGNOSIS — M4125 Other idiopathic scoliosis, thoracolumbar region: Secondary | ICD-10-CM | POA: Diagnosis not present

## 2021-08-08 DIAGNOSIS — M5116 Intervertebral disc disorders with radiculopathy, lumbar region: Secondary | ICD-10-CM | POA: Diagnosis not present

## 2021-08-08 DIAGNOSIS — M545 Low back pain, unspecified: Secondary | ICD-10-CM | POA: Diagnosis not present

## 2021-08-08 DIAGNOSIS — M4316 Spondylolisthesis, lumbar region: Secondary | ICD-10-CM | POA: Diagnosis not present

## 2021-08-08 DIAGNOSIS — Z4789 Encounter for other orthopedic aftercare: Secondary | ICD-10-CM | POA: Diagnosis not present

## 2021-08-08 DIAGNOSIS — M4716 Other spondylosis with myelopathy, lumbar region: Secondary | ICD-10-CM | POA: Diagnosis not present

## 2021-08-10 DIAGNOSIS — Z4789 Encounter for other orthopedic aftercare: Secondary | ICD-10-CM | POA: Diagnosis not present

## 2021-08-10 DIAGNOSIS — M545 Low back pain, unspecified: Secondary | ICD-10-CM | POA: Diagnosis not present

## 2021-08-10 DIAGNOSIS — M4316 Spondylolisthesis, lumbar region: Secondary | ICD-10-CM | POA: Diagnosis not present

## 2021-08-10 DIAGNOSIS — M4716 Other spondylosis with myelopathy, lumbar region: Secondary | ICD-10-CM | POA: Diagnosis not present

## 2021-08-10 DIAGNOSIS — M4125 Other idiopathic scoliosis, thoracolumbar region: Secondary | ICD-10-CM | POA: Diagnosis not present

## 2021-08-10 DIAGNOSIS — M5116 Intervertebral disc disorders with radiculopathy, lumbar region: Secondary | ICD-10-CM | POA: Diagnosis not present

## 2021-08-11 DIAGNOSIS — M4125 Other idiopathic scoliosis, thoracolumbar region: Secondary | ICD-10-CM | POA: Diagnosis not present

## 2021-08-11 DIAGNOSIS — Z4789 Encounter for other orthopedic aftercare: Secondary | ICD-10-CM | POA: Diagnosis not present

## 2021-08-11 DIAGNOSIS — M4716 Other spondylosis with myelopathy, lumbar region: Secondary | ICD-10-CM | POA: Diagnosis not present

## 2021-08-11 DIAGNOSIS — M545 Low back pain, unspecified: Secondary | ICD-10-CM | POA: Diagnosis not present

## 2021-08-11 DIAGNOSIS — M5116 Intervertebral disc disorders with radiculopathy, lumbar region: Secondary | ICD-10-CM | POA: Diagnosis not present

## 2021-08-11 DIAGNOSIS — M4316 Spondylolisthesis, lumbar region: Secondary | ICD-10-CM | POA: Diagnosis not present

## 2021-08-16 DIAGNOSIS — M4716 Other spondylosis with myelopathy, lumbar region: Secondary | ICD-10-CM | POA: Diagnosis not present

## 2021-08-16 DIAGNOSIS — M5116 Intervertebral disc disorders with radiculopathy, lumbar region: Secondary | ICD-10-CM | POA: Diagnosis not present

## 2021-08-16 DIAGNOSIS — M545 Low back pain, unspecified: Secondary | ICD-10-CM | POA: Diagnosis not present

## 2021-08-16 DIAGNOSIS — M4125 Other idiopathic scoliosis, thoracolumbar region: Secondary | ICD-10-CM | POA: Diagnosis not present

## 2021-08-16 DIAGNOSIS — Z4789 Encounter for other orthopedic aftercare: Secondary | ICD-10-CM | POA: Diagnosis not present

## 2021-08-16 DIAGNOSIS — M4316 Spondylolisthesis, lumbar region: Secondary | ICD-10-CM | POA: Diagnosis not present

## 2021-08-18 DIAGNOSIS — M4125 Other idiopathic scoliosis, thoracolumbar region: Secondary | ICD-10-CM | POA: Diagnosis not present

## 2021-08-18 DIAGNOSIS — M5116 Intervertebral disc disorders with radiculopathy, lumbar region: Secondary | ICD-10-CM | POA: Diagnosis not present

## 2021-08-18 DIAGNOSIS — Z4789 Encounter for other orthopedic aftercare: Secondary | ICD-10-CM | POA: Diagnosis not present

## 2021-08-18 DIAGNOSIS — M545 Low back pain, unspecified: Secondary | ICD-10-CM | POA: Diagnosis not present

## 2021-08-18 DIAGNOSIS — M4716 Other spondylosis with myelopathy, lumbar region: Secondary | ICD-10-CM | POA: Diagnosis not present

## 2021-08-18 DIAGNOSIS — M4316 Spondylolisthesis, lumbar region: Secondary | ICD-10-CM | POA: Diagnosis not present

## 2021-08-22 DIAGNOSIS — M5416 Radiculopathy, lumbar region: Secondary | ICD-10-CM | POA: Diagnosis not present

## 2021-08-23 DIAGNOSIS — Z23 Encounter for immunization: Secondary | ICD-10-CM | POA: Diagnosis not present

## 2021-08-24 DIAGNOSIS — M4716 Other spondylosis with myelopathy, lumbar region: Secondary | ICD-10-CM | POA: Diagnosis not present

## 2021-08-24 DIAGNOSIS — M4316 Spondylolisthesis, lumbar region: Secondary | ICD-10-CM | POA: Diagnosis not present

## 2021-08-24 DIAGNOSIS — M5116 Intervertebral disc disorders with radiculopathy, lumbar region: Secondary | ICD-10-CM | POA: Diagnosis not present

## 2021-08-24 DIAGNOSIS — M4125 Other idiopathic scoliosis, thoracolumbar region: Secondary | ICD-10-CM | POA: Diagnosis not present

## 2021-08-24 DIAGNOSIS — M545 Low back pain, unspecified: Secondary | ICD-10-CM | POA: Diagnosis not present

## 2021-08-24 DIAGNOSIS — Z4789 Encounter for other orthopedic aftercare: Secondary | ICD-10-CM | POA: Diagnosis not present

## 2021-08-28 DIAGNOSIS — M8580 Other specified disorders of bone density and structure, unspecified site: Secondary | ICD-10-CM | POA: Diagnosis not present

## 2021-08-28 DIAGNOSIS — I10 Essential (primary) hypertension: Secondary | ICD-10-CM | POA: Diagnosis not present

## 2021-08-28 DIAGNOSIS — Z4789 Encounter for other orthopedic aftercare: Secondary | ICD-10-CM | POA: Diagnosis not present

## 2021-08-28 DIAGNOSIS — M545 Low back pain, unspecified: Secondary | ICD-10-CM | POA: Diagnosis not present

## 2021-08-28 DIAGNOSIS — M4316 Spondylolisthesis, lumbar region: Secondary | ICD-10-CM | POA: Diagnosis not present

## 2021-08-28 DIAGNOSIS — Z981 Arthrodesis status: Secondary | ICD-10-CM | POA: Diagnosis not present

## 2021-08-28 DIAGNOSIS — M5116 Intervertebral disc disorders with radiculopathy, lumbar region: Secondary | ICD-10-CM | POA: Diagnosis not present

## 2021-08-28 DIAGNOSIS — M4125 Other idiopathic scoliosis, thoracolumbar region: Secondary | ICD-10-CM | POA: Diagnosis not present

## 2021-08-28 DIAGNOSIS — Z9181 History of falling: Secondary | ICD-10-CM | POA: Diagnosis not present

## 2021-08-28 DIAGNOSIS — M4716 Other spondylosis with myelopathy, lumbar region: Secondary | ICD-10-CM | POA: Diagnosis not present

## 2021-08-29 DIAGNOSIS — Z4789 Encounter for other orthopedic aftercare: Secondary | ICD-10-CM | POA: Diagnosis not present

## 2021-08-29 DIAGNOSIS — M545 Low back pain, unspecified: Secondary | ICD-10-CM | POA: Diagnosis not present

## 2021-08-29 DIAGNOSIS — M4125 Other idiopathic scoliosis, thoracolumbar region: Secondary | ICD-10-CM | POA: Diagnosis not present

## 2021-08-29 DIAGNOSIS — M4316 Spondylolisthesis, lumbar region: Secondary | ICD-10-CM | POA: Diagnosis not present

## 2021-08-29 DIAGNOSIS — M5116 Intervertebral disc disorders with radiculopathy, lumbar region: Secondary | ICD-10-CM | POA: Diagnosis not present

## 2021-08-29 DIAGNOSIS — M4716 Other spondylosis with myelopathy, lumbar region: Secondary | ICD-10-CM | POA: Diagnosis not present

## 2021-08-31 DIAGNOSIS — M4316 Spondylolisthesis, lumbar region: Secondary | ICD-10-CM | POA: Diagnosis not present

## 2021-08-31 DIAGNOSIS — Z4789 Encounter for other orthopedic aftercare: Secondary | ICD-10-CM | POA: Diagnosis not present

## 2021-08-31 DIAGNOSIS — M4716 Other spondylosis with myelopathy, lumbar region: Secondary | ICD-10-CM | POA: Diagnosis not present

## 2021-08-31 DIAGNOSIS — M5116 Intervertebral disc disorders with radiculopathy, lumbar region: Secondary | ICD-10-CM | POA: Diagnosis not present

## 2021-08-31 DIAGNOSIS — M4125 Other idiopathic scoliosis, thoracolumbar region: Secondary | ICD-10-CM | POA: Diagnosis not present

## 2021-08-31 DIAGNOSIS — M545 Low back pain, unspecified: Secondary | ICD-10-CM | POA: Diagnosis not present

## 2021-09-13 DIAGNOSIS — M4316 Spondylolisthesis, lumbar region: Secondary | ICD-10-CM | POA: Diagnosis not present

## 2021-09-13 DIAGNOSIS — M545 Low back pain, unspecified: Secondary | ICD-10-CM | POA: Diagnosis not present

## 2021-09-13 DIAGNOSIS — M4716 Other spondylosis with myelopathy, lumbar region: Secondary | ICD-10-CM | POA: Diagnosis not present

## 2021-09-13 DIAGNOSIS — M4125 Other idiopathic scoliosis, thoracolumbar region: Secondary | ICD-10-CM | POA: Diagnosis not present

## 2021-09-13 DIAGNOSIS — M5116 Intervertebral disc disorders with radiculopathy, lumbar region: Secondary | ICD-10-CM | POA: Diagnosis not present

## 2021-09-13 DIAGNOSIS — Z4789 Encounter for other orthopedic aftercare: Secondary | ICD-10-CM | POA: Diagnosis not present

## 2021-10-03 DIAGNOSIS — J3089 Other allergic rhinitis: Secondary | ICD-10-CM | POA: Diagnosis not present

## 2021-10-03 DIAGNOSIS — J301 Allergic rhinitis due to pollen: Secondary | ICD-10-CM | POA: Diagnosis not present

## 2021-10-19 DIAGNOSIS — M545 Low back pain, unspecified: Secondary | ICD-10-CM | POA: Diagnosis not present

## 2021-11-14 DIAGNOSIS — M2560 Stiffness of unspecified joint, not elsewhere classified: Secondary | ICD-10-CM | POA: Diagnosis not present

## 2021-11-14 DIAGNOSIS — M545 Low back pain, unspecified: Secondary | ICD-10-CM | POA: Diagnosis not present

## 2021-11-14 DIAGNOSIS — M4326 Fusion of spine, lumbar region: Secondary | ICD-10-CM | POA: Diagnosis not present

## 2021-11-14 DIAGNOSIS — R293 Abnormal posture: Secondary | ICD-10-CM | POA: Diagnosis not present

## 2021-11-17 ENCOUNTER — Other Ambulatory Visit: Payer: Self-pay | Admitting: Obstetrics and Gynecology

## 2021-11-17 DIAGNOSIS — M545 Low back pain, unspecified: Secondary | ICD-10-CM | POA: Diagnosis not present

## 2021-11-17 DIAGNOSIS — R293 Abnormal posture: Secondary | ICD-10-CM | POA: Diagnosis not present

## 2021-11-17 DIAGNOSIS — Z1231 Encounter for screening mammogram for malignant neoplasm of breast: Secondary | ICD-10-CM

## 2021-11-17 DIAGNOSIS — M4326 Fusion of spine, lumbar region: Secondary | ICD-10-CM | POA: Diagnosis not present

## 2021-11-17 DIAGNOSIS — M2560 Stiffness of unspecified joint, not elsewhere classified: Secondary | ICD-10-CM | POA: Diagnosis not present

## 2021-11-21 DIAGNOSIS — M545 Low back pain, unspecified: Secondary | ICD-10-CM | POA: Diagnosis not present

## 2021-11-21 DIAGNOSIS — R293 Abnormal posture: Secondary | ICD-10-CM | POA: Diagnosis not present

## 2021-11-21 DIAGNOSIS — M2560 Stiffness of unspecified joint, not elsewhere classified: Secondary | ICD-10-CM | POA: Diagnosis not present

## 2021-11-21 DIAGNOSIS — M4326 Fusion of spine, lumbar region: Secondary | ICD-10-CM | POA: Diagnosis not present

## 2021-11-24 DIAGNOSIS — M2560 Stiffness of unspecified joint, not elsewhere classified: Secondary | ICD-10-CM | POA: Diagnosis not present

## 2021-11-24 DIAGNOSIS — R293 Abnormal posture: Secondary | ICD-10-CM | POA: Diagnosis not present

## 2021-11-24 DIAGNOSIS — M4326 Fusion of spine, lumbar region: Secondary | ICD-10-CM | POA: Diagnosis not present

## 2021-11-24 DIAGNOSIS — M545 Low back pain, unspecified: Secondary | ICD-10-CM | POA: Diagnosis not present

## 2021-11-29 DIAGNOSIS — M2560 Stiffness of unspecified joint, not elsewhere classified: Secondary | ICD-10-CM | POA: Diagnosis not present

## 2021-11-29 DIAGNOSIS — M545 Low back pain, unspecified: Secondary | ICD-10-CM | POA: Diagnosis not present

## 2021-11-29 DIAGNOSIS — R293 Abnormal posture: Secondary | ICD-10-CM | POA: Diagnosis not present

## 2021-11-29 DIAGNOSIS — M4326 Fusion of spine, lumbar region: Secondary | ICD-10-CM | POA: Diagnosis not present

## 2021-12-01 DIAGNOSIS — R293 Abnormal posture: Secondary | ICD-10-CM | POA: Diagnosis not present

## 2021-12-01 DIAGNOSIS — M2560 Stiffness of unspecified joint, not elsewhere classified: Secondary | ICD-10-CM | POA: Diagnosis not present

## 2021-12-01 DIAGNOSIS — M545 Low back pain, unspecified: Secondary | ICD-10-CM | POA: Diagnosis not present

## 2021-12-01 DIAGNOSIS — M4326 Fusion of spine, lumbar region: Secondary | ICD-10-CM | POA: Diagnosis not present

## 2021-12-05 DIAGNOSIS — M19041 Primary osteoarthritis, right hand: Secondary | ICD-10-CM | POA: Diagnosis not present

## 2021-12-05 DIAGNOSIS — M25642 Stiffness of left hand, not elsewhere classified: Secondary | ICD-10-CM | POA: Diagnosis not present

## 2021-12-05 DIAGNOSIS — M9689 Other intraoperative and postprocedural complications and disorders of the musculoskeletal system: Secondary | ICD-10-CM | POA: Diagnosis not present

## 2021-12-05 DIAGNOSIS — M180 Bilateral primary osteoarthritis of first carpometacarpal joints: Secondary | ICD-10-CM | POA: Diagnosis not present

## 2021-12-05 DIAGNOSIS — M25641 Stiffness of right hand, not elsewhere classified: Secondary | ICD-10-CM | POA: Diagnosis not present

## 2021-12-05 DIAGNOSIS — M19042 Primary osteoarthritis, left hand: Secondary | ICD-10-CM | POA: Diagnosis not present

## 2021-12-06 DIAGNOSIS — M545 Low back pain, unspecified: Secondary | ICD-10-CM | POA: Diagnosis not present

## 2021-12-06 DIAGNOSIS — R293 Abnormal posture: Secondary | ICD-10-CM | POA: Diagnosis not present

## 2021-12-06 DIAGNOSIS — M2560 Stiffness of unspecified joint, not elsewhere classified: Secondary | ICD-10-CM | POA: Diagnosis not present

## 2021-12-06 DIAGNOSIS — M4326 Fusion of spine, lumbar region: Secondary | ICD-10-CM | POA: Diagnosis not present

## 2021-12-08 DIAGNOSIS — M2560 Stiffness of unspecified joint, not elsewhere classified: Secondary | ICD-10-CM | POA: Diagnosis not present

## 2021-12-08 DIAGNOSIS — M545 Low back pain, unspecified: Secondary | ICD-10-CM | POA: Diagnosis not present

## 2021-12-08 DIAGNOSIS — M4326 Fusion of spine, lumbar region: Secondary | ICD-10-CM | POA: Diagnosis not present

## 2021-12-08 DIAGNOSIS — R293 Abnormal posture: Secondary | ICD-10-CM | POA: Diagnosis not present

## 2021-12-12 DIAGNOSIS — R293 Abnormal posture: Secondary | ICD-10-CM | POA: Diagnosis not present

## 2021-12-12 DIAGNOSIS — M545 Low back pain, unspecified: Secondary | ICD-10-CM | POA: Diagnosis not present

## 2021-12-12 DIAGNOSIS — M4326 Fusion of spine, lumbar region: Secondary | ICD-10-CM | POA: Diagnosis not present

## 2021-12-12 DIAGNOSIS — M2560 Stiffness of unspecified joint, not elsewhere classified: Secondary | ICD-10-CM | POA: Diagnosis not present

## 2021-12-15 DIAGNOSIS — M4326 Fusion of spine, lumbar region: Secondary | ICD-10-CM | POA: Diagnosis not present

## 2021-12-15 DIAGNOSIS — M2560 Stiffness of unspecified joint, not elsewhere classified: Secondary | ICD-10-CM | POA: Diagnosis not present

## 2021-12-15 DIAGNOSIS — M545 Low back pain, unspecified: Secondary | ICD-10-CM | POA: Diagnosis not present

## 2021-12-15 DIAGNOSIS — R293 Abnormal posture: Secondary | ICD-10-CM | POA: Diagnosis not present

## 2021-12-19 DIAGNOSIS — M25641 Stiffness of right hand, not elsewhere classified: Secondary | ICD-10-CM | POA: Diagnosis not present

## 2021-12-19 DIAGNOSIS — R293 Abnormal posture: Secondary | ICD-10-CM | POA: Diagnosis not present

## 2021-12-19 DIAGNOSIS — M545 Low back pain, unspecified: Secondary | ICD-10-CM | POA: Diagnosis not present

## 2021-12-19 DIAGNOSIS — M19042 Primary osteoarthritis, left hand: Secondary | ICD-10-CM | POA: Diagnosis not present

## 2021-12-19 DIAGNOSIS — M2560 Stiffness of unspecified joint, not elsewhere classified: Secondary | ICD-10-CM | POA: Diagnosis not present

## 2021-12-19 DIAGNOSIS — M4326 Fusion of spine, lumbar region: Secondary | ICD-10-CM | POA: Diagnosis not present

## 2021-12-19 DIAGNOSIS — R52 Pain, unspecified: Secondary | ICD-10-CM | POA: Diagnosis not present

## 2021-12-19 DIAGNOSIS — M19041 Primary osteoarthritis, right hand: Secondary | ICD-10-CM | POA: Diagnosis not present

## 2021-12-19 DIAGNOSIS — M25642 Stiffness of left hand, not elsewhere classified: Secondary | ICD-10-CM | POA: Diagnosis not present

## 2021-12-22 DIAGNOSIS — M545 Low back pain, unspecified: Secondary | ICD-10-CM | POA: Diagnosis not present

## 2021-12-22 DIAGNOSIS — M4326 Fusion of spine, lumbar region: Secondary | ICD-10-CM | POA: Diagnosis not present

## 2021-12-22 DIAGNOSIS — M2560 Stiffness of unspecified joint, not elsewhere classified: Secondary | ICD-10-CM | POA: Diagnosis not present

## 2021-12-22 DIAGNOSIS — R293 Abnormal posture: Secondary | ICD-10-CM | POA: Diagnosis not present

## 2021-12-26 ENCOUNTER — Ambulatory Visit
Admission: RE | Admit: 2021-12-26 | Discharge: 2021-12-26 | Disposition: A | Discharge status: Home / Self Care | Payer: Medicare Other | Source: Ambulatory Visit | Attending: Obstetrics and Gynecology | Admitting: Obstetrics and Gynecology

## 2021-12-26 ENCOUNTER — Other Ambulatory Visit: Payer: Self-pay

## 2021-12-26 DIAGNOSIS — Z1231 Encounter for screening mammogram for malignant neoplasm of breast: Secondary | ICD-10-CM | POA: Diagnosis not present

## 2021-12-28 DIAGNOSIS — M4326 Fusion of spine, lumbar region: Secondary | ICD-10-CM | POA: Diagnosis not present

## 2021-12-28 DIAGNOSIS — R293 Abnormal posture: Secondary | ICD-10-CM | POA: Diagnosis not present

## 2021-12-28 DIAGNOSIS — M545 Low back pain, unspecified: Secondary | ICD-10-CM | POA: Diagnosis not present

## 2021-12-28 DIAGNOSIS — M2560 Stiffness of unspecified joint, not elsewhere classified: Secondary | ICD-10-CM | POA: Diagnosis not present

## 2021-12-30 DIAGNOSIS — M2560 Stiffness of unspecified joint, not elsewhere classified: Secondary | ICD-10-CM | POA: Diagnosis not present

## 2021-12-30 DIAGNOSIS — R293 Abnormal posture: Secondary | ICD-10-CM | POA: Diagnosis not present

## 2021-12-30 DIAGNOSIS — M545 Low back pain, unspecified: Secondary | ICD-10-CM | POA: Diagnosis not present

## 2021-12-30 DIAGNOSIS — M4326 Fusion of spine, lumbar region: Secondary | ICD-10-CM | POA: Diagnosis not present

## 2022-01-02 DIAGNOSIS — M25641 Stiffness of right hand, not elsewhere classified: Secondary | ICD-10-CM | POA: Diagnosis not present

## 2022-01-02 DIAGNOSIS — M19041 Primary osteoarthritis, right hand: Secondary | ICD-10-CM | POA: Diagnosis not present

## 2022-01-02 DIAGNOSIS — M25642 Stiffness of left hand, not elsewhere classified: Secondary | ICD-10-CM | POA: Diagnosis not present

## 2022-01-02 DIAGNOSIS — M19042 Primary osteoarthritis, left hand: Secondary | ICD-10-CM | POA: Diagnosis not present

## 2022-01-02 DIAGNOSIS — R293 Abnormal posture: Secondary | ICD-10-CM | POA: Diagnosis not present

## 2022-01-02 DIAGNOSIS — M545 Low back pain, unspecified: Secondary | ICD-10-CM | POA: Diagnosis not present

## 2022-01-02 DIAGNOSIS — M2560 Stiffness of unspecified joint, not elsewhere classified: Secondary | ICD-10-CM | POA: Diagnosis not present

## 2022-01-02 DIAGNOSIS — M4326 Fusion of spine, lumbar region: Secondary | ICD-10-CM | POA: Diagnosis not present

## 2022-01-05 DIAGNOSIS — M545 Low back pain, unspecified: Secondary | ICD-10-CM | POA: Diagnosis not present

## 2022-01-05 DIAGNOSIS — M2560 Stiffness of unspecified joint, not elsewhere classified: Secondary | ICD-10-CM | POA: Diagnosis not present

## 2022-01-05 DIAGNOSIS — R293 Abnormal posture: Secondary | ICD-10-CM | POA: Diagnosis not present

## 2022-01-05 DIAGNOSIS — M4326 Fusion of spine, lumbar region: Secondary | ICD-10-CM | POA: Diagnosis not present

## 2022-01-09 DIAGNOSIS — R293 Abnormal posture: Secondary | ICD-10-CM | POA: Diagnosis not present

## 2022-01-09 DIAGNOSIS — M545 Low back pain, unspecified: Secondary | ICD-10-CM | POA: Diagnosis not present

## 2022-01-09 DIAGNOSIS — M2560 Stiffness of unspecified joint, not elsewhere classified: Secondary | ICD-10-CM | POA: Diagnosis not present

## 2022-01-09 DIAGNOSIS — H25813 Combined forms of age-related cataract, bilateral: Secondary | ICD-10-CM | POA: Diagnosis not present

## 2022-01-09 DIAGNOSIS — M4326 Fusion of spine, lumbar region: Secondary | ICD-10-CM | POA: Diagnosis not present

## 2022-01-09 DIAGNOSIS — H40013 Open angle with borderline findings, low risk, bilateral: Secondary | ICD-10-CM | POA: Diagnosis not present

## 2022-01-13 DIAGNOSIS — R293 Abnormal posture: Secondary | ICD-10-CM | POA: Diagnosis not present

## 2022-01-13 DIAGNOSIS — M4326 Fusion of spine, lumbar region: Secondary | ICD-10-CM | POA: Diagnosis not present

## 2022-01-13 DIAGNOSIS — M545 Low back pain, unspecified: Secondary | ICD-10-CM | POA: Diagnosis not present

## 2022-01-13 DIAGNOSIS — M2560 Stiffness of unspecified joint, not elsewhere classified: Secondary | ICD-10-CM | POA: Diagnosis not present

## 2022-01-17 DIAGNOSIS — M19041 Primary osteoarthritis, right hand: Secondary | ICD-10-CM | POA: Diagnosis not present

## 2022-01-17 DIAGNOSIS — M2560 Stiffness of unspecified joint, not elsewhere classified: Secondary | ICD-10-CM | POA: Diagnosis not present

## 2022-01-17 DIAGNOSIS — M25642 Stiffness of left hand, not elsewhere classified: Secondary | ICD-10-CM | POA: Diagnosis not present

## 2022-01-17 DIAGNOSIS — R293 Abnormal posture: Secondary | ICD-10-CM | POA: Diagnosis not present

## 2022-01-17 DIAGNOSIS — M4326 Fusion of spine, lumbar region: Secondary | ICD-10-CM | POA: Diagnosis not present

## 2022-01-17 DIAGNOSIS — M545 Low back pain, unspecified: Secondary | ICD-10-CM | POA: Diagnosis not present

## 2022-01-17 DIAGNOSIS — M19042 Primary osteoarthritis, left hand: Secondary | ICD-10-CM | POA: Diagnosis not present

## 2022-01-18 DIAGNOSIS — M2560 Stiffness of unspecified joint, not elsewhere classified: Secondary | ICD-10-CM | POA: Diagnosis not present

## 2022-01-18 DIAGNOSIS — R293 Abnormal posture: Secondary | ICD-10-CM | POA: Diagnosis not present

## 2022-01-18 DIAGNOSIS — M4326 Fusion of spine, lumbar region: Secondary | ICD-10-CM | POA: Diagnosis not present

## 2022-01-18 DIAGNOSIS — M545 Low back pain, unspecified: Secondary | ICD-10-CM | POA: Diagnosis not present

## 2022-01-19 DIAGNOSIS — M549 Dorsalgia, unspecified: Secondary | ICD-10-CM | POA: Diagnosis not present

## 2022-01-19 DIAGNOSIS — M4326 Fusion of spine, lumbar region: Secondary | ICD-10-CM | POA: Diagnosis not present

## 2022-01-24 DIAGNOSIS — R293 Abnormal posture: Secondary | ICD-10-CM | POA: Diagnosis not present

## 2022-01-24 DIAGNOSIS — M545 Low back pain, unspecified: Secondary | ICD-10-CM | POA: Diagnosis not present

## 2022-01-24 DIAGNOSIS — M2560 Stiffness of unspecified joint, not elsewhere classified: Secondary | ICD-10-CM | POA: Diagnosis not present

## 2022-01-24 DIAGNOSIS — M4326 Fusion of spine, lumbar region: Secondary | ICD-10-CM | POA: Diagnosis not present

## 2022-01-26 DIAGNOSIS — M2560 Stiffness of unspecified joint, not elsewhere classified: Secondary | ICD-10-CM | POA: Diagnosis not present

## 2022-01-26 DIAGNOSIS — M4326 Fusion of spine, lumbar region: Secondary | ICD-10-CM | POA: Diagnosis not present

## 2022-01-26 DIAGNOSIS — M545 Low back pain, unspecified: Secondary | ICD-10-CM | POA: Diagnosis not present

## 2022-01-26 DIAGNOSIS — R293 Abnormal posture: Secondary | ICD-10-CM | POA: Diagnosis not present

## 2022-01-30 DIAGNOSIS — M19042 Primary osteoarthritis, left hand: Secondary | ICD-10-CM | POA: Diagnosis not present

## 2022-01-30 DIAGNOSIS — M19041 Primary osteoarthritis, right hand: Secondary | ICD-10-CM | POA: Diagnosis not present

## 2022-01-31 DIAGNOSIS — M2560 Stiffness of unspecified joint, not elsewhere classified: Secondary | ICD-10-CM | POA: Diagnosis not present

## 2022-01-31 DIAGNOSIS — M4326 Fusion of spine, lumbar region: Secondary | ICD-10-CM | POA: Diagnosis not present

## 2022-01-31 DIAGNOSIS — M545 Low back pain, unspecified: Secondary | ICD-10-CM | POA: Diagnosis not present

## 2022-01-31 DIAGNOSIS — R293 Abnormal posture: Secondary | ICD-10-CM | POA: Diagnosis not present

## 2022-02-02 DIAGNOSIS — M545 Low back pain, unspecified: Secondary | ICD-10-CM | POA: Diagnosis not present

## 2022-02-02 DIAGNOSIS — M4326 Fusion of spine, lumbar region: Secondary | ICD-10-CM | POA: Diagnosis not present

## 2022-02-02 DIAGNOSIS — M2560 Stiffness of unspecified joint, not elsewhere classified: Secondary | ICD-10-CM | POA: Diagnosis not present

## 2022-02-02 DIAGNOSIS — R293 Abnormal posture: Secondary | ICD-10-CM | POA: Diagnosis not present

## 2022-02-08 DIAGNOSIS — M545 Low back pain, unspecified: Secondary | ICD-10-CM | POA: Diagnosis not present

## 2022-02-08 DIAGNOSIS — R293 Abnormal posture: Secondary | ICD-10-CM | POA: Diagnosis not present

## 2022-02-08 DIAGNOSIS — M2560 Stiffness of unspecified joint, not elsewhere classified: Secondary | ICD-10-CM | POA: Diagnosis not present

## 2022-02-08 DIAGNOSIS — M4326 Fusion of spine, lumbar region: Secondary | ICD-10-CM | POA: Diagnosis not present

## 2022-02-15 DIAGNOSIS — R293 Abnormal posture: Secondary | ICD-10-CM | POA: Diagnosis not present

## 2022-02-15 DIAGNOSIS — M2560 Stiffness of unspecified joint, not elsewhere classified: Secondary | ICD-10-CM | POA: Diagnosis not present

## 2022-02-15 DIAGNOSIS — M545 Low back pain, unspecified: Secondary | ICD-10-CM | POA: Diagnosis not present

## 2022-02-15 DIAGNOSIS — M4326 Fusion of spine, lumbar region: Secondary | ICD-10-CM | POA: Diagnosis not present

## 2022-03-02 DIAGNOSIS — D2239 Melanocytic nevi of other parts of face: Secondary | ICD-10-CM | POA: Diagnosis not present

## 2022-03-02 DIAGNOSIS — L814 Other melanin hyperpigmentation: Secondary | ICD-10-CM | POA: Diagnosis not present

## 2022-03-02 DIAGNOSIS — L821 Other seborrheic keratosis: Secondary | ICD-10-CM | POA: Diagnosis not present

## 2022-03-02 DIAGNOSIS — D225 Melanocytic nevi of trunk: Secondary | ICD-10-CM | POA: Diagnosis not present

## 2022-03-27 DIAGNOSIS — M19041 Primary osteoarthritis, right hand: Secondary | ICD-10-CM | POA: Diagnosis not present

## 2022-03-27 DIAGNOSIS — M19042 Primary osteoarthritis, left hand: Secondary | ICD-10-CM | POA: Diagnosis not present

## 2022-04-21 DIAGNOSIS — H6121 Impacted cerumen, right ear: Secondary | ICD-10-CM | POA: Diagnosis not present

## 2022-04-21 DIAGNOSIS — H6120 Impacted cerumen, unspecified ear: Secondary | ICD-10-CM | POA: Diagnosis not present

## 2022-04-21 DIAGNOSIS — Z681 Body mass index (BMI) 19 or less, adult: Secondary | ICD-10-CM | POA: Diagnosis not present

## 2022-04-21 DIAGNOSIS — H6122 Impacted cerumen, left ear: Secondary | ICD-10-CM | POA: Diagnosis not present

## 2022-04-21 DIAGNOSIS — J302 Other seasonal allergic rhinitis: Secondary | ICD-10-CM | POA: Diagnosis not present

## 2022-04-24 DIAGNOSIS — I10 Essential (primary) hypertension: Secondary | ICD-10-CM | POA: Diagnosis not present

## 2022-04-24 DIAGNOSIS — H9313 Tinnitus, bilateral: Secondary | ICD-10-CM | POA: Diagnosis not present

## 2022-04-24 DIAGNOSIS — H6501 Acute serous otitis media, right ear: Secondary | ICD-10-CM | POA: Diagnosis not present

## 2022-05-01 DIAGNOSIS — M19041 Primary osteoarthritis, right hand: Secondary | ICD-10-CM | POA: Diagnosis not present

## 2022-05-01 DIAGNOSIS — M19042 Primary osteoarthritis, left hand: Secondary | ICD-10-CM | POA: Diagnosis not present

## 2022-05-24 DIAGNOSIS — J3089 Other allergic rhinitis: Secondary | ICD-10-CM | POA: Diagnosis not present

## 2022-05-24 DIAGNOSIS — J301 Allergic rhinitis due to pollen: Secondary | ICD-10-CM | POA: Diagnosis not present

## 2022-06-05 DIAGNOSIS — J3089 Other allergic rhinitis: Secondary | ICD-10-CM | POA: Diagnosis not present

## 2022-06-05 DIAGNOSIS — R21 Rash and other nonspecific skin eruption: Secondary | ICD-10-CM | POA: Diagnosis not present

## 2022-06-05 DIAGNOSIS — H1045 Other chronic allergic conjunctivitis: Secondary | ICD-10-CM | POA: Diagnosis not present

## 2022-06-05 DIAGNOSIS — J301 Allergic rhinitis due to pollen: Secondary | ICD-10-CM | POA: Diagnosis not present

## 2022-06-12 DIAGNOSIS — M19042 Primary osteoarthritis, left hand: Secondary | ICD-10-CM | POA: Diagnosis not present

## 2022-06-12 DIAGNOSIS — M19041 Primary osteoarthritis, right hand: Secondary | ICD-10-CM | POA: Diagnosis not present

## 2022-07-25 DIAGNOSIS — H900 Conductive hearing loss, bilateral: Secondary | ICD-10-CM | POA: Diagnosis not present

## 2022-07-25 DIAGNOSIS — H9313 Tinnitus, bilateral: Secondary | ICD-10-CM | POA: Diagnosis not present

## 2022-07-25 DIAGNOSIS — H90A31 Mixed conductive and sensorineural hearing loss, unilateral, right ear with restricted hearing on the contralateral side: Secondary | ICD-10-CM | POA: Diagnosis not present

## 2022-07-27 DIAGNOSIS — M4326 Fusion of spine, lumbar region: Secondary | ICD-10-CM | POA: Diagnosis not present

## 2022-08-17 DIAGNOSIS — Z23 Encounter for immunization: Secondary | ICD-10-CM | POA: Diagnosis not present

## 2022-09-12 DIAGNOSIS — Z23 Encounter for immunization: Secondary | ICD-10-CM | POA: Diagnosis not present

## 2022-11-16 ENCOUNTER — Other Ambulatory Visit: Payer: Self-pay | Admitting: Obstetrics and Gynecology

## 2022-11-16 DIAGNOSIS — Z1231 Encounter for screening mammogram for malignant neoplasm of breast: Secondary | ICD-10-CM

## 2022-11-29 DIAGNOSIS — J301 Allergic rhinitis due to pollen: Secondary | ICD-10-CM | POA: Diagnosis not present

## 2022-11-29 DIAGNOSIS — J3089 Other allergic rhinitis: Secondary | ICD-10-CM | POA: Diagnosis not present

## 2022-12-04 DIAGNOSIS — L299 Pruritus, unspecified: Secondary | ICD-10-CM | POA: Diagnosis not present

## 2022-12-04 DIAGNOSIS — L209 Atopic dermatitis, unspecified: Secondary | ICD-10-CM | POA: Diagnosis not present

## 2023-01-02 ENCOUNTER — Ambulatory Visit: Payer: Medicare Other

## 2023-01-04 ENCOUNTER — Other Ambulatory Visit: Payer: Self-pay | Admitting: Orthopedic Surgery

## 2023-01-04 ENCOUNTER — Ambulatory Visit
Admission: RE | Admit: 2023-01-04 | Discharge: 2023-01-04 | Disposition: A | Discharge status: Home / Self Care | Payer: Medicare Other | Source: Ambulatory Visit | Attending: Obstetrics and Gynecology | Admitting: Obstetrics and Gynecology

## 2023-01-04 DIAGNOSIS — M1811 Unilateral primary osteoarthritis of first carpometacarpal joint, right hand: Secondary | ICD-10-CM | POA: Diagnosis not present

## 2023-01-04 DIAGNOSIS — Z1231 Encounter for screening mammogram for malignant neoplasm of breast: Secondary | ICD-10-CM | POA: Diagnosis not present

## 2023-01-04 DIAGNOSIS — M24041 Loose body in right finger joint(s): Secondary | ICD-10-CM | POA: Diagnosis not present

## 2023-01-04 DIAGNOSIS — M79644 Pain in right finger(s): Secondary | ICD-10-CM | POA: Diagnosis not present

## 2023-01-10 DIAGNOSIS — K219 Gastro-esophageal reflux disease without esophagitis: Secondary | ICD-10-CM | POA: Diagnosis not present

## 2023-01-15 ENCOUNTER — Encounter (HOSPITAL_BASED_OUTPATIENT_CLINIC_OR_DEPARTMENT_OTHER)
Admission: RE | Admit: 2023-01-15 | Discharge: 2023-01-15 | Disposition: A | Discharge status: Home / Self Care | Payer: Medicare Other | Source: Ambulatory Visit | Attending: Orthopedic Surgery | Admitting: Orthopedic Surgery

## 2023-01-15 ENCOUNTER — Encounter (HOSPITAL_BASED_OUTPATIENT_CLINIC_OR_DEPARTMENT_OTHER): Payer: Self-pay | Admitting: Orthopedic Surgery

## 2023-01-15 ENCOUNTER — Other Ambulatory Visit: Payer: Self-pay

## 2023-01-15 DIAGNOSIS — H40013 Open angle with borderline findings, low risk, bilateral: Secondary | ICD-10-CM | POA: Diagnosis not present

## 2023-01-15 DIAGNOSIS — Z01810 Encounter for preprocedural cardiovascular examination: Secondary | ICD-10-CM | POA: Diagnosis not present

## 2023-01-15 DIAGNOSIS — H25813 Combined forms of age-related cataract, bilateral: Secondary | ICD-10-CM | POA: Diagnosis not present

## 2023-01-15 NOTE — Progress Notes (Signed)

## 2023-01-22 ENCOUNTER — Ambulatory Visit (HOSPITAL_BASED_OUTPATIENT_CLINIC_OR_DEPARTMENT_OTHER): Payer: Medicare Other | Admitting: Anesthesiology

## 2023-01-22 ENCOUNTER — Other Ambulatory Visit: Payer: Self-pay

## 2023-01-22 ENCOUNTER — Ambulatory Visit (HOSPITAL_BASED_OUTPATIENT_CLINIC_OR_DEPARTMENT_OTHER)
Admission: RE | Admit: 2023-01-22 | Discharge: 2023-01-22 | Disposition: A | Discharge status: Home / Self Care | Payer: Medicare Other | Attending: Orthopedic Surgery | Admitting: Orthopedic Surgery

## 2023-01-22 ENCOUNTER — Encounter (HOSPITAL_BASED_OUTPATIENT_CLINIC_OR_DEPARTMENT_OTHER)
Admission: RE | Disposition: A | Discharge status: Home / Self Care | Payer: Self-pay | Source: Home / Self Care | Attending: Orthopedic Surgery

## 2023-01-22 ENCOUNTER — Encounter (HOSPITAL_BASED_OUTPATIENT_CLINIC_OR_DEPARTMENT_OTHER): Payer: Self-pay | Admitting: Orthopedic Surgery

## 2023-01-22 DIAGNOSIS — L988 Other specified disorders of the skin and subcutaneous tissue: Secondary | ICD-10-CM | POA: Diagnosis not present

## 2023-01-22 DIAGNOSIS — L729 Follicular cyst of the skin and subcutaneous tissue, unspecified: Secondary | ICD-10-CM | POA: Diagnosis not present

## 2023-01-22 DIAGNOSIS — Z79899 Other long term (current) drug therapy: Secondary | ICD-10-CM | POA: Insufficient documentation

## 2023-01-22 DIAGNOSIS — M7989 Other specified soft tissue disorders: Secondary | ICD-10-CM | POA: Diagnosis not present

## 2023-01-22 DIAGNOSIS — L989 Disorder of the skin and subcutaneous tissue, unspecified: Secondary | ICD-10-CM | POA: Diagnosis not present

## 2023-01-22 DIAGNOSIS — R2231 Localized swelling, mass and lump, right upper limb: Secondary | ICD-10-CM

## 2023-01-22 DIAGNOSIS — M1811 Unilateral primary osteoarthritis of first carpometacarpal joint, right hand: Secondary | ICD-10-CM | POA: Diagnosis not present

## 2023-01-22 DIAGNOSIS — K219 Gastro-esophageal reflux disease without esophagitis: Secondary | ICD-10-CM | POA: Insufficient documentation

## 2023-01-22 DIAGNOSIS — I10 Essential (primary) hypertension: Secondary | ICD-10-CM | POA: Diagnosis not present

## 2023-01-22 HISTORY — PX: EXCISION METACARPAL MASS: SHX6372

## 2023-01-22 SURGERY — EXCISION METACARPAL MASS
Anesthesia: General | Laterality: Right

## 2023-01-22 MED ORDER — EPHEDRINE SULFATE-NACL 50-0.9 MG/10ML-% IV SOSY
PREFILLED_SYRINGE | INTRAVENOUS | Status: DC | PRN
  Administered 2023-01-22: 10 mg via INTRAVENOUS

## 2023-01-22 MED ORDER — FENTANYL CITRATE (PF) 100 MCG/2ML IJ SOLN
INTRAMUSCULAR | Status: AC
  Filled 2023-01-22: qty 2

## 2023-01-22 MED ORDER — ACETAMINOPHEN 500 MG PO TABS
1000.0000 mg | ORAL_TABLET | Freq: Once | ORAL | Status: AC
  Administered 2023-01-22: 650 mg via ORAL

## 2023-01-22 MED ORDER — OXYCODONE HCL 5 MG/5ML PO SOLN: 5.0000 mg | Freq: Once | ORAL | Status: AC | PRN

## 2023-01-22 MED ORDER — DEXAMETHASONE SODIUM PHOSPHATE 10 MG/ML IJ SOLN
INTRAMUSCULAR | Status: DC | PRN
  Administered 2023-01-22: 5 mg via INTRAVENOUS

## 2023-01-22 MED ORDER — LACTATED RINGERS IV SOLN: INTRAVENOUS | Status: DC

## 2023-01-22 MED ORDER — OXYCODONE HCL 5 MG PO TABS
5.0000 mg | ORAL_TABLET | Freq: Once | ORAL | Status: AC | PRN
  Administered 2023-01-22: 5 mg via ORAL

## 2023-01-22 MED ORDER — VANCOMYCIN HCL IN DEXTROSE 1-5 GM/200ML-% IV SOLN
INTRAVENOUS | Status: AC
  Filled 2023-01-22: qty 200

## 2023-01-22 MED ORDER — OXYCODONE HCL 5 MG PO TABS
ORAL_TABLET | ORAL | Status: AC
  Filled 2023-01-22: qty 1

## 2023-01-22 MED ORDER — BUPIVACAINE HCL (PF) 0.25 % IJ SOLN
INTRAMUSCULAR | Status: DC | PRN
  Administered 2023-01-22: 9 mL

## 2023-01-22 MED ORDER — ACETAMINOPHEN 325 MG RE SUPP
RECTAL | Status: AC
  Filled 2023-01-22: qty 2

## 2023-01-22 MED ORDER — FENTANYL CITRATE (PF) 100 MCG/2ML IJ SOLN
INTRAMUSCULAR | Status: DC | PRN
  Administered 2023-01-22 (×2): 50 ug via INTRAVENOUS

## 2023-01-22 MED ORDER — ACETAMINOPHEN 325 MG PO TABS
ORAL_TABLET | ORAL | Status: AC
  Filled 2023-01-22: qty 2

## 2023-01-22 MED ORDER — FENTANYL CITRATE (PF) 100 MCG/2ML IJ SOLN: 25.0000 ug | INTRAMUSCULAR | Status: DC | PRN

## 2023-01-22 MED ORDER — VANCOMYCIN HCL IN DEXTROSE 1-5 GM/200ML-% IV SOLN
1000.0000 mg | INTRAVENOUS | Status: AC
  Administered 2023-01-22: 1000 mg via INTRAVENOUS

## 2023-01-22 MED ORDER — MIDAZOLAM HCL 2 MG/2ML IJ SOLN
INTRAMUSCULAR | Status: AC
  Filled 2023-01-22: qty 2

## 2023-01-22 MED ORDER — LIDOCAINE HCL (CARDIAC) PF 100 MG/5ML IV SOSY
PREFILLED_SYRINGE | INTRAVENOUS | Status: DC | PRN
  Administered 2023-01-22: 60 mg via INTRAVENOUS

## 2023-01-22 MED ORDER — MIDAZOLAM HCL 5 MG/5ML IJ SOLN
INTRAMUSCULAR | Status: DC | PRN
  Administered 2023-01-22: 2 mg via INTRAVENOUS

## 2023-01-22 MED ORDER — 0.9 % SODIUM CHLORIDE (POUR BTL) OPTIME
TOPICAL | Status: DC | PRN
  Administered 2023-01-22: 50 mL

## 2023-01-22 MED ORDER — ONDANSETRON HCL 4 MG/2ML IJ SOLN
INTRAMUSCULAR | Status: DC | PRN
  Administered 2023-01-22: 4 mg via INTRAVENOUS

## 2023-01-22 MED ORDER — PROPOFOL 10 MG/ML IV BOLUS
INTRAVENOUS | Status: DC | PRN
  Administered 2023-01-22: 120 mg via INTRAVENOUS

## 2023-01-22 MED ORDER — ONDANSETRON HCL 4 MG/2ML IJ SOLN
INTRAMUSCULAR | Status: AC
  Filled 2023-01-22: qty 2

## 2023-01-22 MED ORDER — TRAMADOL HCL 50 MG PO TABS: ORAL_TABLET | ORAL | 0 refills | Status: DC

## 2023-01-22 MED ORDER — EPHEDRINE 5 MG/ML INJ
INTRAVENOUS | Status: AC
  Filled 2023-01-22: qty 5

## 2023-01-22 MED ORDER — ONDANSETRON HCL 4 MG/2ML IJ SOLN: 4.0000 mg | Freq: Once | INTRAMUSCULAR | Status: DC | PRN

## 2023-01-22 SURGICAL SUPPLY — 55 items
APL PRP STRL LF DISP 70% ISPRP (MISCELLANEOUS) ×1
APL SKNCLS STERI-STRIP NONHPOA (GAUZE/BANDAGES/DRESSINGS)
BANDAGE GAUZE 1X75IN STRL (MISCELLANEOUS) IMPLANT
BENZOIN TINCTURE PRP APPL 2/3 (GAUZE/BANDAGES/DRESSINGS) IMPLANT
BLADE MINI RND TIP GREEN BEAV (BLADE) IMPLANT
BLADE SURG 15 STRL LF DISP TIS (BLADE) ×2 IMPLANT
BLADE SURG 15 STRL SS (BLADE) ×2
BNDG CMPR 5X2 CHSV 1 LYR STRL (GAUZE/BANDAGES/DRESSINGS)
BNDG CMPR 5X2 KNTD ELC UNQ LF (GAUZE/BANDAGES/DRESSINGS)
BNDG CMPR 5X3 KNIT ELC UNQ LF (GAUZE/BANDAGES/DRESSINGS)
BNDG CMPR 75X11 PLY HI ABS (MISCELLANEOUS)
BNDG CMPR 75X21 PLY HI ABS (MISCELLANEOUS)
BNDG CMPR 9X4 STRL LF SNTH (GAUZE/BANDAGES/DRESSINGS) ×1
BNDG COHESIVE 1X5 TAN STRL LF (GAUZE/BANDAGES/DRESSINGS) IMPLANT
BNDG COHESIVE 2X5 TAN ST LF (GAUZE/BANDAGES/DRESSINGS) IMPLANT
BNDG ELASTIC 2INX 5YD STR LF (GAUZE/BANDAGES/DRESSINGS) IMPLANT
BNDG ELASTIC 3INX 5YD STR LF (GAUZE/BANDAGES/DRESSINGS) IMPLANT
BNDG ESMARK 4X9 LF (GAUZE/BANDAGES/DRESSINGS) IMPLANT
BNDG GAUZE 1X75IN STRL (MISCELLANEOUS)
BNDG GAUZE DERMACEA FLUFF 4 (GAUZE/BANDAGES/DRESSINGS) IMPLANT
BNDG GZE DERMACEA 4 6PLY (GAUZE/BANDAGES/DRESSINGS)
BNDG PLASTER X FAST 3X3 WHT LF (CAST SUPPLIES) IMPLANT
BNDG PLSTR 9X3 FST ST WHT (CAST SUPPLIES)
CHLORAPREP W/TINT 26 (MISCELLANEOUS) ×1 IMPLANT
CORD BIPOLAR FORCEPS 12FT (ELECTRODE) ×1 IMPLANT
COVER BACK TABLE 60X90IN (DRAPES) ×1 IMPLANT
COVER MAYO STAND STRL (DRAPES) ×1 IMPLANT
CUFF TOURN SGL QUICK 18X4 (TOURNIQUET CUFF) ×1 IMPLANT
DRAPE EXTREMITY T 121X128X90 (DISPOSABLE) ×1 IMPLANT
DRAPE SURG 17X23 STRL (DRAPES) ×1 IMPLANT
GAUZE SPONGE 4X4 12PLY STRL (GAUZE/BANDAGES/DRESSINGS) ×1 IMPLANT
GAUZE STRETCH 2X75IN STRL (MISCELLANEOUS) IMPLANT
GAUZE XEROFORM 1X8 LF (GAUZE/BANDAGES/DRESSINGS) ×1 IMPLANT
GLOVE BIO SURGEON STRL SZ7.5 (GLOVE) ×1 IMPLANT
GLOVE BIOGEL PI IND STRL 8 (GLOVE) ×1 IMPLANT
GOWN STRL REUS W/ TWL LRG LVL3 (GOWN DISPOSABLE) ×1 IMPLANT
GOWN STRL REUS W/TWL LRG LVL3 (GOWN DISPOSABLE) ×1
GOWN STRL REUS W/TWL XL LVL3 (GOWN DISPOSABLE) ×1 IMPLANT
NDL HYPO 25X1 1.5 SAFETY (NEEDLE) ×1 IMPLANT
NEEDLE HYPO 25X1 1.5 SAFETY (NEEDLE) ×1 IMPLANT
NS IRRIG 1000ML POUR BTL (IV SOLUTION) ×1 IMPLANT
PACK BASIN DAY SURGERY FS (CUSTOM PROCEDURE TRAY) ×1 IMPLANT
PAD CAST 3X4 CTTN HI CHSV (CAST SUPPLIES) IMPLANT
PAD CAST 4YDX4 CTTN HI CHSV (CAST SUPPLIES) IMPLANT
PADDING CAST ABS COTTON 4X4 ST (CAST SUPPLIES) ×1 IMPLANT
PADDING CAST COTTON 3X4 STRL (CAST SUPPLIES)
PADDING CAST COTTON 4X4 STRL (CAST SUPPLIES)
STOCKINETTE 4X48 STRL (DRAPES) ×1 IMPLANT
STRIP CLOSURE SKIN 1/2X4 (GAUZE/BANDAGES/DRESSINGS) IMPLANT
SUT ETHILON 3 0 PS 1 (SUTURE) IMPLANT
SUT ETHILON 4 0 PS 2 18 (SUTURE) ×1 IMPLANT
SYR BULB EAR ULCER 3OZ GRN STR (SYRINGE) ×1 IMPLANT
SYR CONTROL 10ML LL (SYRINGE) ×1 IMPLANT
TOWEL GREEN STERILE FF (TOWEL DISPOSABLE) ×2 IMPLANT
UNDERPAD 30X36 HEAVY ABSORB (UNDERPADS AND DIAPERS) ×1 IMPLANT

## 2023-01-22 NOTE — H&P (Signed)
Brandy Davis is an 72 y.o. female.   Chief Complaint: mass HPI: 72 yo female with right thumb mass.  It is bothersome to her.  She wishes to have it removed and the IP joint debrided to try to prevent recurrence.  Allergies:  Allergies  Allergen Reactions   Other      Onions, garlic, peppers   Penicillins Other (See Comments)    UNKNOWN - AS A CHILD    Past Medical History:  Diagnosis Date   Arthritis of carpometacarpal (CMC) joint of right thumb 04/2018   GERD (gastroesophageal reflux disease)    diet-controlled   Hypertension    states under control with med., has been on med. since 2014   Seasonal allergies     Past Surgical History:  Procedure Laterality Date   CARPOMETACARPEL SUSPENSION PLASTY Right 05/02/2018   Procedure: CARPOMETACARPAL ([place]) SUSPENSIONPLASTY RIGHT THUMB, EXCISION TRAPEZIUM;  Surgeon: Brandy Brod, MD;  Location: [place];  Service: Orthopedics;  Laterality: Right;   FINGER ARTHRODESIS Left 11/27/2019   Procedure: ARTHRODESIS LEFT INDEX LEFT SMALL FINGER, DISTAL INTERPHALANGEAL JOINTS WITH PINNING;  Surgeon: Brandy Brod, MD;  Location: [place];  Service: Orthopedics;  Laterality: Left;  AXILLARY   LASIK     TENDON TRANSFER Right 05/02/2018   Procedure: ABDUCTOR POLLICIS LONGUS TRANSFER;  Surgeon: Brandy Brod, MD;  Location: [place];  Service: Orthopedics;  Laterality: Right;   TONSILLECTOMY     age 69    Family History: Family History  Problem Relation Age of Onset   Breast cancer Neg Hx     Social History:   reports that she has never smoked. She has never used smokeless tobacco. She reports current alcohol use. She reports that she does not use drugs.  Medications: Medications Prior to Admission  Medication Sig Dispense Refill   Calcium Carbonate (CALCIUM 600 PO) Take by mouth.     lisinopril (PRINIVIL,ZESTRIL) 2.5 MG tablet Take 2.5 mg by mouth daily.     Multiple Vitamin (MULTIVITAMIN)  tablet Take 1 tablet by mouth daily.      No results found for this or any previous visit (from the past 48 hour(s)).  No results found.    Blood pressure (!) 177/93, pulse 79, temperature (!) 97.3 F (36.3 C), temperature source Oral, height 5\' 3"  (1.6 m), weight 40.8 kg, SpO2 100 %.  General appearance: alert, cooperative, and appears stated age Head: Normocephalic, without obvious abnormality, atraumatic Neck: supple, symmetrical, trachea midline Extremities: Intact sensation and capillary refill all digits.  +epl/fpl/io.  No wounds.  Pulses: 2+ and symmetric Skin: Skin color, texture, turgor normal. No rashes or lesions Neurologic: Grossly normal Incision/Wound: none  Assessment/Plan Right thumb cyst and ip joint arthritis.  Recommend excision mass and debridement of ip joint.  Risks, benefits and alternatives of surgery were discussed including risks of blood loss, infection, damage to nerves/vessels/tendons/ligament/bone, failure of surgery, need for additional surgery, complication with wound healing, stiffness, recurrence.  She voiced understanding of these risks and elected to proceed.    Brandy Davis 01/22/2023, 12:54 PM

## 2023-01-22 NOTE — Transfer of Care (Signed)
Immediate Anesthesia Transfer of Care Note  Patient: Brandy Davis  Procedure(s) Performed: EXCISION MASS RIGHT THUMB INTERPHALANGEAL JOINT (Right)  Patient Location: PACU  Anesthesia Type:General  Level of Consciousness: sedated  Airway & Oxygen Therapy: Patient Spontanous Breathing and Patient connected to face mask oxygen  Post-op Assessment: Report given to RN and Post -op Vital signs reviewed and stable  Post vital signs: Reviewed and stable  Last Vitals:  Vitals Value Taken Time  BP    Temp    Pulse 71 01/22/23 1444  Resp 9 01/22/23 1444  SpO2 100 % 01/22/23 1444  Vitals shown include unvalidated device data.  Last Pain:  Vitals:   01/22/23 1200  TempSrc: Oral  PainSc: 0-No pain      Patients Stated Pain Goal: 3 ([id number] [phone])  Complications: No notable events documented.

## 2023-01-22 NOTE — Anesthesia Procedure Notes (Signed)
Procedure Name: LMA Insertion Date/Time: 01/22/2023 2:07 PM  Performed by: Tawni Millers, CRNAPre-anesthesia Checklist: Patient identified, Emergency Drugs available, Suction available and Patient being monitored Patient Re-evaluated:Patient Re-evaluated prior to induction Oxygen Delivery Method: Circle system utilized Preoxygenation: Pre-oxygenation with 100% oxygen Induction Type: IV induction Ventilation: Mask ventilation without difficulty LMA: LMA inserted LMA Size: 3.0 Number of attempts: 1 Airway Equipment and Method: Bite block Placement Confirmation: positive ETCO2 Tube secured with: Tape Dental Injury: Teeth and Oropharynx as per pre-operative assessment

## 2023-01-22 NOTE — Anesthesia Preprocedure Evaluation (Addendum)
Anesthesia Evaluation  Patient identified by MRN, date of birth, ID band Patient awake    Reviewed: Allergy & Precautions, NPO status , Patient's Chart, lab work & pertinent test results  Airway Mallampati: I  TM Distance: >3 FB Neck ROM: Full    Dental  (+) Teeth Intact, Dental Advisory Given   Pulmonary neg pulmonary ROS   Pulmonary exam normal breath sounds clear to auscultation       Cardiovascular hypertension, Pt. on medications Normal cardiovascular exam Rhythm:Regular Rate:Normal     Neuro/Psych negative neurological ROS  negative psych ROS   GI/Hepatic Neg liver ROS,GERD  Controlled,,  Endo/Other  negative endocrine ROS    Renal/GU negative Renal ROS  negative genitourinary   Musculoskeletal  (+) Arthritis , Osteoarthritis,    Abdominal   Peds  Hematology negative hematology ROS (+)   Anesthesia Other Findings   Reproductive/Obstetrics negative OB ROS                             Anesthesia Physical Anesthesia Plan  ASA: 2  Anesthesia Plan: General   Post-op Pain Management: Tylenol PO (pre-op)*   Induction:   PONV Risk Score and Plan: 2 and Propofol infusion and TIVA  Airway Management Planned: LMA  Additional Equipment: None  Intra-op Plan:   Post-operative Plan: Extubation in OR  Informed Consent: I have reviewed the patients History and Physical, chart, labs and discussed the procedure including the risks, benefits and alternatives for the proposed anesthesia with the patient or authorized representative who has indicated his/her understanding and acceptance.     Dental advisory given  Plan Discussed with: CRNA  Anesthesia Plan Comments:         Anesthesia Quick Evaluation

## 2023-01-22 NOTE — Op Note (Signed)
NAME: Brandy Davis MEDICAL RECORD NO: [id number] DATE OF BIRTH: 16-May-1951 [facility] LOCATION: [facility] PHYSICIAN: Tennis Must, MD   OPERATIVE REPORT   DATE OF PROCEDURE: 01/22/23    PREOPERATIVE DIAGNOSIS: Right thumb cystic mass   POSTOPERATIVE DIAGNOSIS: Right thumb cystic mass/skin lesion   PROCEDURE: Excision of skin lesion right thumb, 2 cm excised diameter   SURGEON:  Leanora Cover, M.D.   ASSISTANT: none   ANESTHESIA:  General   INTRAVENOUS FLUIDS:  Per anesthesia flow sheet.   ESTIMATED BLOOD LOSS:  Minimal.   COMPLICATIONS:  None.   SPECIMENS: Cultures to micro, skin lesion to pathology   TOURNIQUET TIME:    Total Tourniquet Time Documented: Upper Arm (Right) - 19 minutes Total: Upper Arm (Right) - 19 minutes    DISPOSITION:  Stable to PACU.   INDICATIONS: 72 year old female with mass of right thumb.  She wished to have this removed.  Risks, benefits and alternatives of surgery were discussed including the risks of blood loss, infection, damage to nerves, vessels, tendons, ligaments, bone for surgery, need for additional surgery, complications with wound healing, continued pain, stiffness, , recurrence.  She voiced understanding of these risks and elected to proceed.  OPERATIVE COURSE:  After being identified preoperatively by myself,  the patient and I agreed on the procedure and site of the procedure.  The surgical site was marked.  Surgical consent had been signed. Preoperative IV antibiotic prophylaxis was given. She was transferred to the operating room and placed on the operating table in supine position with the Right upper extremity on an arm board.  General anesthesia was induced by the anesthesiologist.  Right upper extremity was prepped and draped in normal sterile orthopedic fashion.  A surgical pause was performed between the surgeons, anesthesia, and operating room staff and all were in agreement as to the patient, procedure,  and site of procedure.  Tourniquet at the proximal aspect of the extremity was inflated to 250 mmHg after exsanguination of the arm with an Esmarch bandage.  Incision was made along the dorsal border of the mass.  This was carried in subcutaneous tissues by spreading technique.  There was no definitive mass in the deeper tissues.  There was empty space under the skin.  There is no purulence.  Cultures were taken.  The fat was brownish in coloration.  This was removed as best possible.  The skin lesion was then excised in its entirety.  A total of 2 cm excised diameter was taken with the longer diameter in the longitudinal plane.  This was sent to pathology for examination.  The wound was copiously irrigated with sterile saline.  Was closed with 4-0 nylon in a horizontal mattress fashion.  Digital block was performed with quarter percent plain Marcaine to aid in postoperative analgesia.  The wound was dressed with sterile Xeroform 4 x 4 and wrapped with a Coban dressing lightly.  An AlumaFoam splint was placed and wrapped lightly with Coban dressing.  The tourniquet was deflated at 19 minutes.  Fingertips were pink with brisk capillary refill after deflation of tourniquet.  The operative  drapes were broken down.  The patient was awoken from anesthesia safely.  She was transferred back to the stretcher and taken to PACU in stable condition.  I will see her back in the office in 1 week for postoperative followup.  I will give her a prescription for Tramadol 50 mg 1-2 tabs PO q6 hours prn pain, dispense #  24.   Leanora Cover, MD Electronically signed, 01/22/23

## 2023-01-22 NOTE — Anesthesia Postprocedure Evaluation (Signed)
Anesthesia Post Note  Patient: Kennley H Tandon  Procedure(s) Performed: EXCISION MASS RIGHT THUMB INTERPHALANGEAL JOINT (Right)     Patient location during evaluation: PACU Anesthesia Type: General Level of consciousness: awake and alert Pain management: pain level controlled Vital Signs Assessment: post-procedure vital signs reviewed and stable Respiratory status: spontaneous breathing, nonlabored ventilation and respiratory function stable Cardiovascular status: blood pressure returned to baseline and stable Postop Assessment: no apparent nausea or vomiting Anesthetic complications: no  No notable events documented.  Last Vitals:  Vitals:   01/22/23 1515 01/22/23 1526  BP: (!) 165/92 (!) 180/94  Pulse: 70 80  Resp: 13 16  Temp:  (!) 36.2 C  SpO2: 98% 100%    Last Pain:  Vitals:   01/22/23 1529  TempSrc:   PainSc: 4                  Jovan Colligan,W. EDMOND

## 2023-01-22 NOTE — Discharge Instructions (Addendum)
  Post Anesthesia Home Care Instructions  Activity: Get plenty of rest for the remainder of the day. A responsible individual must stay with you for 24 hours following the procedure.  For the next 24 hours, DO NOT: -Drive a car -Paediatric nurse -Drink alcoholic beverages -Take any medication unless instructed by your physician -Make any legal decisions or sign important papers.  Meals: Start with liquid foods such as gelatin or soup. Progress to regular foods as tolerated. Avoid greasy, spicy, heavy foods. If nausea and/or vomiting occur, drink only clear liquids until the nausea and/or vomiting subsides. Call your physician if vomiting continues.  Special Instructions/Symptoms: Your throat may feel dry or sore from the anesthesia or the breathing tube placed in your throat during surgery. If this causes discomfort, gargle with warm salt water. The discomfort should disappear within 24 hours.  If you had a scopolamine patch placed behind your ear for the management of post- operative nausea and/or vomiting:  1. The medication in the patch is effective for 72 hours, after which it should be removed.  Wrap patch in a tissue and discard in the trash. Wash hands thoroughly with soap and water. 2. You may remove the patch earlier than 72 hours if you experience unpleasant side effects which may include dry mouth, dizziness or visual disturbances. 3. Avoid touching the patch. Wash your hands with soap and water after contact with the patch.    May take Tylenol after 6:02pm  Hand Center Instructions Hand Surgery  Wound Care: Keep your hand elevated above the level of your heart.  Do not allow it to dangle by your side.  Keep the dressing dry and do not remove it unless your doctor advises you to do so.  He will usually change it at the time of your post-op visit.  Moving your fingers is advised to stimulate circulation but will depend on the site of your surgery.  If you have a splint  applied, your doctor will advise you regarding movement.  Activity: Do not drive or operate machinery today.  Rest today and then you may return to your normal activity and work as indicated by your physician.  Diet:  Drink liquids today or eat a light diet.  You may resume a regular diet tomorrow.    General expectations: Pain for two to three days. Fingers may become slightly swollen.  Call your doctor if any of the following occur: Severe pain not relieved by pain medication. Elevated temperature. Dressing soaked with blood. Inability to move fingers. White or bluish color to fingers.

## 2023-01-23 ENCOUNTER — Encounter (HOSPITAL_BASED_OUTPATIENT_CLINIC_OR_DEPARTMENT_OTHER): Payer: Self-pay | Admitting: Orthopedic Surgery

## 2023-01-25 LAB — SURGICAL PATHOLOGY

## 2023-01-27 LAB — AEROBIC/ANAEROBIC CULTURE W GRAM STAIN (SURGICAL/DEEP WOUND)

## 2023-02-05 DIAGNOSIS — R2231 Localized swelling, mass and lump, right upper limb: Secondary | ICD-10-CM | POA: Diagnosis not present

## 2023-02-09 DIAGNOSIS — Z09 Encounter for follow-up examination after completed treatment for conditions other than malignant neoplasm: Secondary | ICD-10-CM | POA: Diagnosis not present

## 2023-02-09 DIAGNOSIS — Z86010 Personal history of colonic polyps: Secondary | ICD-10-CM | POA: Diagnosis not present

## 2023-02-09 DIAGNOSIS — K648 Other hemorrhoids: Secondary | ICD-10-CM | POA: Diagnosis not present

## 2023-02-09 DIAGNOSIS — Z1211 Encounter for screening for malignant neoplasm of colon: Secondary | ICD-10-CM | POA: Diagnosis not present

## 2023-02-09 DIAGNOSIS — K644 Residual hemorrhoidal skin tags: Secondary | ICD-10-CM | POA: Diagnosis not present

## 2023-03-02 DIAGNOSIS — R2231 Localized swelling, mass and lump, right upper limb: Secondary | ICD-10-CM | POA: Diagnosis not present

## 2023-03-16 DIAGNOSIS — J3089 Other allergic rhinitis: Secondary | ICD-10-CM | POA: Diagnosis not present

## 2023-04-03 DIAGNOSIS — L820 Inflamed seborrheic keratosis: Secondary | ICD-10-CM | POA: Diagnosis not present

## 2023-04-03 DIAGNOSIS — L821 Other seborrheic keratosis: Secondary | ICD-10-CM | POA: Diagnosis not present

## 2023-04-03 DIAGNOSIS — D2239 Melanocytic nevi of other parts of face: Secondary | ICD-10-CM | POA: Diagnosis not present

## 2023-04-03 DIAGNOSIS — D225 Melanocytic nevi of trunk: Secondary | ICD-10-CM | POA: Diagnosis not present

## 2023-04-03 DIAGNOSIS — L814 Other melanin hyperpigmentation: Secondary | ICD-10-CM | POA: Diagnosis not present

## 2023-04-10 DIAGNOSIS — J3089 Other allergic rhinitis: Secondary | ICD-10-CM | POA: Diagnosis not present

## 2023-04-16 ENCOUNTER — Telehealth: Payer: Self-pay

## 2023-04-16 NOTE — Patient Outreach (Signed)
  Care Coordination   04/16/2023 Name: [name] MRN: [id number] DOB: Mar 13, 1951   Care Coordination Outreach Attempts:  An unsuccessful telephone outreach was attempted today to offer the patient information about available care coordination services.  Follow Up Plan:  Additional outreach attempts will be made to offer the patient care coordination information and services.   Encounter Outcome:  No Answer   Care Coordination Interventions:  No, not indicated    Rowe Pavy, RN, BSN, [facility] [employer] [phone]

## 2023-04-18 ENCOUNTER — Telehealth: Payer: Self-pay

## 2023-04-18 NOTE — Patient Outreach (Signed)
  Care Coordination   04/18/2023 Name: Brandy Davis MRN: [id number] DOB: 09/14/1951   Care Coordination Outreach Attempts:  A second unsuccessful outreach was attempted today to offer the patient with information about available care coordination services.  Follow Up Plan:  Additional outreach attempts will be made to offer the patient care coordination information and services.   Encounter Outcome:  No Answer   Care Coordination Interventions:  No, not indicated    Rowe Pavy, RN, BSN, [facility] [employer] [phone]

## 2023-04-19 ENCOUNTER — Telehealth: Payer: Self-pay

## 2023-04-19 NOTE — Patient Outreach (Signed)
  Care Coordination   04/19/2023 Name: Brandy Davis MRN: [id number] DOB: 1951-04-07   Care Coordination Outreach Attempts:  A third unsuccessful outreach was attempted today to offer the patient with information about available care coordination services.  Follow Up Plan:  No further outreach attempts will be made at this time. We have been unable to contact the patient to offer or enroll patient in care coordination services  Encounter Outcome:  No Answer   Care Coordination Interventions:  No, not indicated    Rowe Pavy, RN, BSN, CEN [facility] Coordinator [phone]

## 2023-05-09 DIAGNOSIS — J3089 Other allergic rhinitis: Secondary | ICD-10-CM | POA: Diagnosis not present

## 2023-05-18 DIAGNOSIS — J3089 Other allergic rhinitis: Secondary | ICD-10-CM | POA: Diagnosis not present

## 2023-05-25 DIAGNOSIS — J3089 Other allergic rhinitis: Secondary | ICD-10-CM | POA: Diagnosis not present

## 2023-06-04 DIAGNOSIS — J3089 Other allergic rhinitis: Secondary | ICD-10-CM | POA: Diagnosis not present

## 2023-06-04 DIAGNOSIS — J309 Allergic rhinitis, unspecified: Secondary | ICD-10-CM | POA: Diagnosis not present

## 2023-06-04 DIAGNOSIS — J301 Allergic rhinitis due to pollen: Secondary | ICD-10-CM | POA: Diagnosis not present

## 2023-06-04 DIAGNOSIS — H1045 Other chronic allergic conjunctivitis: Secondary | ICD-10-CM | POA: Diagnosis not present

## 2023-06-04 DIAGNOSIS — R21 Rash and other nonspecific skin eruption: Secondary | ICD-10-CM | POA: Diagnosis not present

## 2023-06-07 DIAGNOSIS — J301 Allergic rhinitis due to pollen: Secondary | ICD-10-CM | POA: Diagnosis not present

## 2023-06-07 DIAGNOSIS — J3089 Other allergic rhinitis: Secondary | ICD-10-CM | POA: Diagnosis not present

## 2023-06-14 DIAGNOSIS — J309 Allergic rhinitis, unspecified: Secondary | ICD-10-CM | POA: Diagnosis not present

## 2023-06-21 DIAGNOSIS — Z79899 Other long term (current) drug therapy: Secondary | ICD-10-CM | POA: Diagnosis not present

## 2023-06-21 DIAGNOSIS — I10 Essential (primary) hypertension: Secondary | ICD-10-CM | POA: Diagnosis not present

## 2023-06-21 DIAGNOSIS — M8580 Other specified disorders of bone density and structure, unspecified site: Secondary | ICD-10-CM | POA: Diagnosis not present

## 2023-06-21 DIAGNOSIS — M8589 Other specified disorders of bone density and structure, multiple sites: Secondary | ICD-10-CM | POA: Diagnosis not present

## 2023-06-21 DIAGNOSIS — Z0000 Encounter for general adult medical examination without abnormal findings: Secondary | ICD-10-CM | POA: Diagnosis not present

## 2023-06-21 DIAGNOSIS — E7800 Pure hypercholesterolemia, unspecified: Secondary | ICD-10-CM | POA: Diagnosis not present

## 2023-06-21 DIAGNOSIS — E559 Vitamin D deficiency, unspecified: Secondary | ICD-10-CM | POA: Diagnosis not present

## 2023-06-21 DIAGNOSIS — Z681 Body mass index (BMI) 19 or less, adult: Secondary | ICD-10-CM | POA: Diagnosis not present

## 2023-06-27 DIAGNOSIS — J309 Allergic rhinitis, unspecified: Secondary | ICD-10-CM | POA: Diagnosis not present

## 2023-07-03 DIAGNOSIS — J309 Allergic rhinitis, unspecified: Secondary | ICD-10-CM | POA: Diagnosis not present

## 2023-07-10 DIAGNOSIS — J309 Allergic rhinitis, unspecified: Secondary | ICD-10-CM | POA: Diagnosis not present

## 2023-08-15 DIAGNOSIS — J309 Allergic rhinitis, unspecified: Secondary | ICD-10-CM | POA: Diagnosis not present

## 2023-08-22 DIAGNOSIS — M79641 Pain in right hand: Secondary | ICD-10-CM | POA: Diagnosis not present

## 2023-08-22 DIAGNOSIS — M79642 Pain in left hand: Secondary | ICD-10-CM | POA: Diagnosis not present

## 2023-08-23 DIAGNOSIS — Z23 Encounter for immunization: Secondary | ICD-10-CM | POA: Diagnosis not present

## 2023-08-23 DIAGNOSIS — E559 Vitamin D deficiency, unspecified: Secondary | ICD-10-CM | POA: Diagnosis not present

## 2023-08-24 DIAGNOSIS — M79641 Pain in right hand: Secondary | ICD-10-CM | POA: Diagnosis not present

## 2023-08-24 DIAGNOSIS — M79642 Pain in left hand: Secondary | ICD-10-CM | POA: Diagnosis not present

## 2023-09-11 DIAGNOSIS — J309 Allergic rhinitis, unspecified: Secondary | ICD-10-CM | POA: Diagnosis not present

## 2023-10-09 DIAGNOSIS — J309 Allergic rhinitis, unspecified: Secondary | ICD-10-CM | POA: Diagnosis not present

## 2023-11-28 ENCOUNTER — Other Ambulatory Visit: Payer: Self-pay | Admitting: Family Medicine

## 2023-11-28 DIAGNOSIS — Z1231 Encounter for screening mammogram for malignant neoplasm of breast: Secondary | ICD-10-CM

## 2024-01-10 ENCOUNTER — Ambulatory Visit
Admission: RE | Admit: 2024-01-10 | Discharge: 2024-01-10 | Disposition: A | Discharge status: Home / Self Care | Payer: Medicare Other | Source: Ambulatory Visit | Attending: Family Medicine | Admitting: Family Medicine

## 2024-01-10 DIAGNOSIS — Z1231 Encounter for screening mammogram for malignant neoplasm of breast: Secondary | ICD-10-CM

## 2024-11-10 ENCOUNTER — Other Ambulatory Visit: Payer: Self-pay | Admitting: Orthopedic Surgery

## 2024-12-02 ENCOUNTER — Encounter (HOSPITAL_BASED_OUTPATIENT_CLINIC_OR_DEPARTMENT_OTHER): Payer: Self-pay | Admitting: Orthopedic Surgery

## 2024-12-02 ENCOUNTER — Other Ambulatory Visit: Payer: Self-pay

## 2024-12-04 ENCOUNTER — Other Ambulatory Visit: Payer: Self-pay | Admitting: Obstetrics and Gynecology

## 2024-12-04 DIAGNOSIS — Z1231 Encounter for screening mammogram for malignant neoplasm of breast: Secondary | ICD-10-CM

## 2024-12-08 ENCOUNTER — Encounter (HOSPITAL_BASED_OUTPATIENT_CLINIC_OR_DEPARTMENT_OTHER): Payer: Self-pay | Admitting: Orthopedic Surgery

## 2024-12-08 NOTE — Anesthesia Preprocedure Evaluation (Signed)
"                                    Anesthesia Evaluation  Patient identified by MRN, date of birth, ID band Patient awake    Reviewed: Allergy & Precautions, NPO status , Patient's Chart, lab work & pertinent test results  Airway Mallampati: II  TM Distance: >3 FB     Dental no notable dental hx. (+) Teeth Intact, Caps, Dental Advisory Given   Pulmonary neg pulmonary ROS   Pulmonary exam normal breath sounds clear to auscultation       Cardiovascular hypertension, Pt. on medications Normal cardiovascular exam Rhythm:Regular Rate:Normal     Neuro/Psych negative neurological ROS  negative psych ROS   GI/Hepatic Neg liver ROS,GERD  Medicated,,  Endo/Other  negative endocrine ROS    Renal/GU negative Renal ROS  negative genitourinary   Musculoskeletal  (+) Arthritis , Osteoarthritis,  Right middle finger arthritis   Abdominal   Peds  Hematology negative hematology ROS (+)   Anesthesia Other Findings   Reproductive/Obstetrics                              Anesthesia Physical Anesthesia Plan  ASA: 2  Anesthesia Plan: General   Post-op Pain Management: Minimal or no pain anticipated   Induction: Intravenous  PONV Risk Score and Plan: 3 and Treatment may vary due to age or medical condition and Propofol  infusion  Airway Management Planned: LMA  Additional Equipment: None  Intra-op Plan:   Post-operative Plan:   Informed Consent: I have reviewed the patients History and Physical, chart, labs and discussed the procedure including the risks, benefits and alternatives for the proposed anesthesia with the patient or authorized representative who has indicated his/her understanding and acceptance.     Dental advisory given  Plan Discussed with: CRNA and Anesthesiologist  Anesthesia Plan Comments:          Anesthesia Quick Evaluation  "

## 2024-12-09 ENCOUNTER — Ambulatory Visit (HOSPITAL_BASED_OUTPATIENT_CLINIC_OR_DEPARTMENT_OTHER)

## 2024-12-09 ENCOUNTER — Encounter (HOSPITAL_BASED_OUTPATIENT_CLINIC_OR_DEPARTMENT_OTHER)
Admission: RE | Disposition: A | Discharge status: Home / Self Care | Payer: Self-pay | Source: Home / Self Care | Attending: Orthopedic Surgery

## 2024-12-09 ENCOUNTER — Encounter (HOSPITAL_BASED_OUTPATIENT_CLINIC_OR_DEPARTMENT_OTHER): Payer: Self-pay | Admitting: Orthopedic Surgery

## 2024-12-09 ENCOUNTER — Ambulatory Visit (HOSPITAL_BASED_OUTPATIENT_CLINIC_OR_DEPARTMENT_OTHER)
Admission: RE | Admit: 2024-12-09 | Discharge: 2024-12-09 | Disposition: A | Discharge status: Home / Self Care | Attending: Orthopedic Surgery | Admitting: Orthopedic Surgery

## 2024-12-09 ENCOUNTER — Encounter (HOSPITAL_BASED_OUTPATIENT_CLINIC_OR_DEPARTMENT_OTHER): Payer: Self-pay | Admitting: Anesthesiology

## 2024-12-09 ENCOUNTER — Other Ambulatory Visit: Payer: Self-pay

## 2024-12-09 DIAGNOSIS — Z79899 Other long term (current) drug therapy: Secondary | ICD-10-CM | POA: Insufficient documentation

## 2024-12-09 DIAGNOSIS — I10 Essential (primary) hypertension: Secondary | ICD-10-CM | POA: Insufficient documentation

## 2024-12-09 DIAGNOSIS — M1811 Unilateral primary osteoarthritis of first carpometacarpal joint, right hand: Secondary | ICD-10-CM | POA: Insufficient documentation

## 2024-12-09 DIAGNOSIS — K219 Gastro-esophageal reflux disease without esophagitis: Secondary | ICD-10-CM | POA: Insufficient documentation

## 2024-12-09 MED ORDER — PROPOFOL 500 MG/50ML IV EMUL
INTRAVENOUS | Status: DC | PRN
  Administered 2024-12-09: 200 ug/kg/min via INTRAVENOUS

## 2024-12-09 MED ORDER — LACTATED RINGERS IV SOLN: INTRAVENOUS | Status: DC

## 2024-12-09 MED ORDER — LIDOCAINE 2% (20 MG/ML) 5 ML SYRINGE
INTRAMUSCULAR | Status: AC
  Filled 2024-12-09: qty 5

## 2024-12-09 MED ORDER — BUPIVACAINE HCL (PF) 0.25 % IJ SOLN
INTRAMUSCULAR | Status: AC
  Filled 2024-12-09: qty 30

## 2024-12-09 MED ORDER — MIDAZOLAM HCL (PF) 2 MG/2ML IJ SOLN
INTRAMUSCULAR | Status: DC | PRN
  Administered 2024-12-09: 2 mg via INTRAVENOUS

## 2024-12-09 MED ORDER — FENTANYL CITRATE (PF) 100 MCG/2ML IJ SOLN
INTRAMUSCULAR | Status: AC
  Filled 2024-12-09: qty 2

## 2024-12-09 MED ORDER — MIDAZOLAM HCL 2 MG/2ML IJ SOLN
INTRAMUSCULAR | Status: AC
  Filled 2024-12-09: qty 2

## 2024-12-09 MED ORDER — ONDANSETRON HCL 4 MG/2ML IJ SOLN
INTRAMUSCULAR | Status: DC | PRN
  Administered 2024-12-09 (×2): 4 mg via INTRAVENOUS

## 2024-12-09 MED ORDER — OXYCODONE HCL 5 MG/5ML PO SOLN: 5.0000 mg | Freq: Once | ORAL | Status: DC | PRN

## 2024-12-09 MED ORDER — ONDANSETRON HCL 4 MG/2ML IJ SOLN: 4.0000 mg | Freq: Once | INTRAMUSCULAR | Status: DC | PRN

## 2024-12-09 MED ORDER — FENTANYL CITRATE (PF) 100 MCG/2ML IJ SOLN: 25.0000 ug | INTRAMUSCULAR | Status: DC | PRN

## 2024-12-09 MED ORDER — FENTANYL CITRATE (PF) 100 MCG/2ML IJ SOLN
INTRAMUSCULAR | Status: DC | PRN
  Administered 2024-12-09: 50 ug via INTRAVENOUS

## 2024-12-09 MED ORDER — BUPIVACAINE HCL (PF) 0.25 % IJ SOLN
INTRAMUSCULAR | Status: DC | PRN
  Administered 2024-12-09: 8 mL

## 2024-12-09 MED ORDER — EPHEDRINE SULFATE (PRESSORS) 25 MG/5ML IV SOSY
PREFILLED_SYRINGE | INTRAVENOUS | Status: DC | PRN
  Administered 2024-12-09: 5 mg via INTRAVENOUS

## 2024-12-09 MED ORDER — OXYCODONE HCL 5 MG PO TABS: 5.0000 mg | ORAL_TABLET | Freq: Once | ORAL | Status: DC | PRN

## 2024-12-09 MED ORDER — PROPOFOL 10 MG/ML IV BOLUS
INTRAVENOUS | Status: DC | PRN
  Administered 2024-12-09: 120 mg via INTRAVENOUS

## 2024-12-09 MED ORDER — HYDROCODONE-ACETAMINOPHEN 5-325 MG PO TABS: 1.0000 | ORAL_TABLET | Freq: Four times a day (QID) | ORAL | 0 refills | Status: AC | PRN

## 2024-12-09 MED ORDER — CEFAZOLIN SODIUM-DEXTROSE 2-4 GM/100ML-% IV SOLN
INTRAVENOUS | Status: AC
  Filled 2024-12-09: qty 100

## 2024-12-09 MED ORDER — 0.9 % SODIUM CHLORIDE (POUR BTL) OPTIME
TOPICAL | Status: DC | PRN
  Administered 2024-12-09: 200 mL

## 2024-12-09 MED ORDER — ONDANSETRON HCL 4 MG/2ML IJ SOLN
INTRAMUSCULAR | Status: AC
  Filled 2024-12-09: qty 2

## 2024-12-09 NOTE — H&P (Signed)
" °  Brandy Davis is an 74 y.o. female.   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  It is painful and bothersome to her.  She wishes to proceed with mp arthroplasty.  Allergies: Allergies[1]  Past Medical History:  Diagnosis Date   Arthritis of carpometacarpal [facility]) joint of right thumb 04/2018   GERD (gastroesophageal reflux disease)    diet-controlled   Hypertension    states under control with med., has been on med. since 2014   Seasonal allergies     Past Surgical History:  Procedure Laterality Date   CARPOMETACARPEL SUSPENSION PLASTY Right 05/02/2018   Procedure: CARPOMETACARPAL (CMC) SUSPENSIONPLASTY RIGHT THUMB, EXCISION TRAPEZIUM;  Surgeon: Murrell Kuba, MD;  Location: [facility];  Service: Orthopedics;  Laterality: Right;   EXCISION METACARPAL MASS Right 01/22/2023   Procedure: EXCISION MASS RIGHT THUMB INTERPHALANGEAL JOINT;  Surgeon: Murrell Drivers, MD;  Location: [facility];  Service: Orthopedics;  Laterality: Right;  30 MIN   FINGER ARTHRODESIS Left 11/27/2019   Procedure: ARTHRODESIS LEFT INDEX LEFT SMALL FINGER, DISTAL INTERPHALANGEAL JOINTS WITH PINNING;  Surgeon: Murrell Kuba, MD;  Location: [facility];  Service: Orthopedics;  Laterality: Left;  AXILLARY   LASIK     TENDON TRANSFER Right 05/02/2018   Procedure: ABDUCTOR POLLICIS LONGUS TRANSFER;  Surgeon: Murrell Kuba, MD;  Location:  SURGERY CENTER;  Service: Orthopedics;  Laterality: Right;   TONSILLECTOMY     age 62    Family History: Family History  Problem Relation Age of Onset   Breast cancer Neg Hx     Social History:   reports that she has never smoked. She has never used smokeless tobacco. She reports current alcohol use. She reports that she does not use drugs.  Medications: Medications Prior to Admission  Medication Sig Dispense Refill   Calcium Carbonate (CALCIUM 600 PO) Take by mouth.      lisinopril (PRINIVIL,ZESTRIL) 2.5 MG tablet Take 2.5 mg by mouth daily.     Multiple Vitamin (MULTIVITAMIN) tablet Take 1 tablet by mouth daily.     traMADol  (ULTRAM ) 50 MG tablet 1-2 tabs PO q6 hours prn pain 20 tablet 0    No results found for this or any previous visit (from the past 48 hours).  No results found.    Blood pressure (!) 144/90, pulse 80, temperature 98 F (36.7 C), temperature source Oral, resp. rate 16, height 5' 3 (1.6 m), weight 41.7 kg, SpO2 100%.  General appearance: alert, cooperative, and appears stated age Head: Normocephalic, without obvious abnormality, atraumatic Neck: supple, symmetrical, trachea midline Extremities: Intact sensation and capillary refill all digits.  +epl/fpl/io.  No wounds.  Skin: Skin color, texture, turgor normal. No rashes or lesions Neurologic: Grossly normal Incision/Wound: none  Assessment/Plan Right long finger mp arthritis.  Non operative and operative treatment options have been discussed with the patient and patient wishes to proceed with operative treatment. Risks, benefits, and alternatives of surgery have been discussed and the patient agrees with the plan of care.   Brandy Davis 12/09/2024, 12:51 PM     [1]  Allergies Allergen Reactions   Other      Onions, garlic, peppers   Penicillins Other (See Comments)    UNKNOWN - AS A CHILD   "

## 2024-12-09 NOTE — Op Note (Signed)
 NAME: Brandy Davis MEDICAL RECORD NO: [id number] DATE OF BIRTH: 1951-10-28 FACILITY: [facility] LOCATION: [facility] PHYSICIAN: Brandy Orne R. 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   ASSISTANT: Brandy Curia, MD   ANESTHESIA:  General   INTRAVENOUS FLUIDS:  Per anesthesia flow sheet.   ESTIMATED BLOOD LOSS:  Minimal.   COMPLICATIONS:  None.   SPECIMENS:  none   TOURNIQUET TIME:    Total Tourniquet Time Documented: Upper Arm (Right) - 73 minutes Total: Upper Arm (Right) - 73 minutes    DISPOSITION:  Stable to PACU.   INDICATIONS: 74 year old female with right long finger MP joint erosive arthritis.  It is painful and bothersome to her.  She she wishes to proceed with right long finger MP joint arthroplasty for pain control and to try to regain motion of the MP joint.  Risks, benefits and alternatives of surgery were discussed including the risks of blood loss, infection, damage to nerves, vessels, tendons, ligaments, bone for surgery, need for additional surgery, complications with wound healing, continued pain, stiffness.  She voiced understanding of these risks and elected to proceed.  OPERATIVE COURSE:  After being identified preoperatively by myself,  the patient and I agreed on the procedure and site of the procedure.  The surgical site was marked.  Surgical consent had been signed. Preoperative IV antibiotic prophylaxis was given. She was transferred to the operating room and placed on the operating table in supine position with the right upper extremity on an arm board.  General anesthesia was induced by the anesthesiologist.  Right upper extremity was prepped and draped in normal sterile orthopedic fashion.  A  surgical pause was performed between the surgeons, anesthesia, and operating room staff and all were in agreement as to the patient, procedure, and site of procedure.  Tourniquet at the proximal aspect of the extremity was inflated to 250 mmHg after exsanguination of the arm with an Esmarch bandage.  Digital block was performed with quarter percent plain Marcaine  to aid in postoperative analgesia.  Incision was made on the dorsum of the long finger over the MP joint.  This was carried in subcutaneous tissues by spreading technique.  The sagittal band on the ulnar side of the MP joint was sharply incised for later repair.  The capsule was opened.  There was whitish material in the synovium.  This was removed with the rongeurs.  There was degenerative change to the metacarpal head with loss of contour of the head.  There was degenerative change at the base of the proximal phalanx with remaining articular cartilage at the volar edge of the joint and then collapse of the dorsal surface of the joint and dorsum of the base of the proximal phalanx.  The collateral ligaments were released from the metacarpal head.  Guidepin was placed down the metacarpal and the C-arm used in AP lateral oblique projections to ensure appropriate positioning.  It was adjusted until appropriate position was obtained.  The cutting guide was placed into the metacarpal.  This was used to make the distal cut of the metacarpal head.  The base of the proximal phalanx was then accessed.  More of the metacarpal head to be cut back to allow better visualization of  the base of the proximal phalanx and reduction of the MP joint.  A guidepin was placed into the proximal phalanx.  This was adjusted until appropriate position was obtained.  The rongeurs were used to remove the remaining volar articular surface to provide a better bearing area for the implant.  The guide pin was removed and the opening all used.  The broach was then placed.  A 5 mm broach  was used.  This was checked on C arm and filled most of the canal.  The 5 mm meter broach was then used in the metacarpal.  The trial implant was placed after irrigating the joint with sterile saline.  This placed the MP joint into appropriate alignment.  The C-arm was used in AP and lateral projections to ensure appropriate positioning of the MP joint.  The trial was removed.  A hole was drilled in the metacarpal and the radial side to allow repair of the collateral ligament.  A 4-0 Ethibond suture was placed to the collateral ligament and through the drill hole for later repair.  The silicone implant was then placed and the MP joint reduced.  This was checked on C-arm in AP and lateral projections.  The collateral ligament was then repaired.  The capsule was repaired with a running 4-0 chromic suture.  The sagittal band was then repaired with a running 4-0 Mersilene suture.  The C-arm was used in AP and lateral directions to ensure appropriate positioning of the MP joint which was the case.  The skin was closed with 4-0 nylon in a horizontal mattress fashion.  Wound was dressed with sterile Xeroform and 4 x 4's and wrapped with a Kerlix bandage.  A volar splint was placed and wrapped with Kerlix and Ace bandage.  The tourniquet was deflated at 73 minutes.  Fingertips were pink with brisk capillary refill after deflation of tourniquet.  The operative  drapes were broken down.  The patient was awoken from anesthesia safely.  She was transferred back to the stretcher and taken to PACU in stable condition.  I will see her back in the office in 1 week for postoperative followup.  I will give her a prescription for Norco 5/325 1 tab PO q6 hours prn pain, dispense #20.   Brandy Smitherman, MD Electronically signed, 12/09/24

## 2024-12-09 NOTE — Anesthesia Procedure Notes (Signed)
 Procedure Name: LMA Insertion Date/Time: 12/09/2024 2:57 PM  Performed by: Burnard Rosaline HERO, CRNAPre-anesthesia Checklist: Patient identified, Emergency Drugs available, Suction available and Patient being monitored Patient Re-evaluated:Patient Re-evaluated prior to induction Oxygen  Delivery Method: Circle system utilized Preoxygenation: Pre-oxygenation with 100% oxygen  Induction Type: IV induction Ventilation: Mask ventilation without difficulty LMA: LMA inserted LMA Size: 3.0 Number of attempts: 1 Airway Equipment and Method: Bite block Placement Confirmation: positive ETCO2, CO2 detector and breath sounds checked- equal and bilateral Tube secured with: Tape Dental Injury: Teeth and Oropharynx as per pre-operative assessment

## 2024-12-09 NOTE — Op Note (Signed)
 I assisted Surgeons and Role:    * Murrell Drivers, MD - Primary    DEWAINE Murrell Kuba, MD - Assisting on the Procedures: RIGHT LONG FINGER METACARPAL PHALANGEAL ARTHROPLASTY on 12/09/2024.  I provided assistance on this case as follows: Set up, approach, pinning of the joint, resection of the metacarpal head basement of a prosthesis, operation of the proximal phalanx for insertion of a prosthesis, trial of prosthesis, preparation for repair of collateral ligament, placement of the prosthesis, repair of the collateral ligament radial side, closure of the wound and application of the dressing and splint.  Electronically signed by: Kuba Murrell, MD Date: 12/09/2024 Time: 4:21 PM

## 2024-12-09 NOTE — Discharge Instructions (Addendum)

## 2024-12-10 ENCOUNTER — Encounter (HOSPITAL_BASED_OUTPATIENT_CLINIC_OR_DEPARTMENT_OTHER): Payer: Self-pay | Admitting: Orthopedic Surgery

## 2025-01-12 ENCOUNTER — Ambulatory Visit
